# Patient Record
Sex: Female | Born: 1985
Health system: Southern US, Community
[De-identification: ages and names within clinical notes are randomized; demographics above are authoritative.]

## PROBLEM LIST (undated history)

## (undated) DIAGNOSIS — R7303 Prediabetes: Secondary | ICD-10-CM

## (undated) DIAGNOSIS — M5430 Sciatica, unspecified side: Secondary | ICD-10-CM

## (undated) HISTORY — PX: CHOLECYSTECTOMY: SHX55

## (undated) HISTORY — PX: TUBAL LIGATION: SHX77

---

## 2006-07-30 ENCOUNTER — Emergency Department (HOSPITAL_COMMUNITY): Admission: EM | Admit: 2006-07-30 | Discharge: 2006-07-30 | Payer: Self-pay | Admitting: Emergency Medicine

## 2006-08-08 ENCOUNTER — Emergency Department (HOSPITAL_COMMUNITY): Admission: EM | Admit: 2006-08-08 | Discharge: 2006-08-08 | Payer: Self-pay | Admitting: Emergency Medicine

## 2006-10-03 ENCOUNTER — Emergency Department (HOSPITAL_COMMUNITY): Admission: EM | Admit: 2006-10-03 | Discharge: 2006-10-03 | Payer: Self-pay | Admitting: Emergency Medicine

## 2006-12-06 ENCOUNTER — Ambulatory Visit: Payer: Self-pay | Admitting: Nurse Practitioner

## 2006-12-06 LAB — CONVERTED CEMR LAB
Bilirubin Urine: NEGATIVE
Ketones, urine, test strip: NEGATIVE
Protein, U semiquant: NEGATIVE
Urobilinogen, UA: NEGATIVE
pH: 6

## 2007-03-16 ENCOUNTER — Ambulatory Visit: Payer: Self-pay | Admitting: Nurse Practitioner

## 2007-03-16 ENCOUNTER — Other Ambulatory Visit: Admission: RE | Admit: 2007-03-16 | Discharge: 2007-03-16 | Payer: Self-pay | Admitting: Internal Medicine

## 2007-03-16 DIAGNOSIS — M79609 Pain in unspecified limb: Secondary | ICD-10-CM

## 2007-03-16 LAB — CONVERTED CEMR LAB
Albumin: 4.8 g/dL (ref 3.5–5.2)
BUN: 13 mg/dL (ref 6–23)
Blood in Urine, dipstick: NEGATIVE
Calcium: 10.2 mg/dL (ref 8.4–10.5)
Chloride: 105 meq/L (ref 96–112)
Creatinine, Ser: 0.79 mg/dL (ref 0.40–1.20)
Eosinophils Absolute: 0.1 10*3/uL (ref 0.0–0.7)
Glucose, Bld: 83 mg/dL (ref 70–99)
Glucose, Urine, Semiquant: NEGATIVE
HDL: 45 mg/dL (ref >39)
Hemoglobin: 15.1 g/dL — ABNORMAL HIGH (ref 12.0–15.0)
KOH Prep: NEGATIVE
Ketones, urine, test strip: NEGATIVE
Lymphs Abs: 2 10*3/uL (ref 0.7–4.0)
MCHC: 32.8 g/dL (ref 30.0–36.0)
MCV: 89 fL (ref 78.0–100.0)
Monocytes Absolute: 0.7 10*3/uL (ref 0.1–1.0)
Monocytes Relative: 10 % (ref 3–12)
Neutrophils Relative %: 58 % (ref 43–77)
Potassium: 4.5 meq/L (ref 3.5–5.3)
RBC: 5.17 M/uL — ABNORMAL HIGH (ref 3.87–5.11)
Total CHOL/HDL Ratio: 3.4
Triglycerides: 67 mg/dL (ref <150)
WBC Urine, dipstick: NEGATIVE
WBC: 6.6 10*3/uL (ref 4.0–10.5)

## 2007-03-19 ENCOUNTER — Encounter (INDEPENDENT_AMBULATORY_CARE_PROVIDER_SITE_OTHER): Payer: Self-pay | Admitting: Nurse Practitioner

## 2007-03-23 LAB — CONVERTED CEMR LAB: Pap Smear: NEGATIVE

## 2007-04-06 ENCOUNTER — Telehealth (INDEPENDENT_AMBULATORY_CARE_PROVIDER_SITE_OTHER): Payer: Self-pay | Admitting: Nurse Practitioner

## 2007-04-07 ENCOUNTER — Emergency Department (HOSPITAL_COMMUNITY): Admission: EM | Admit: 2007-04-07 | Discharge: 2007-04-07 | Payer: Self-pay | Admitting: Emergency Medicine

## 2007-05-14 ENCOUNTER — Ambulatory Visit: Payer: Self-pay | Admitting: Nurse Practitioner

## 2007-05-14 DIAGNOSIS — F17200 Nicotine dependence, unspecified, uncomplicated: Secondary | ICD-10-CM | POA: Insufficient documentation

## 2007-05-14 DIAGNOSIS — N912 Amenorrhea, unspecified: Secondary | ICD-10-CM

## 2007-05-14 LAB — CONVERTED CEMR LAB
Beta hcg, urine, semiquantitative: POSITIVE
Bilirubin Urine: NEGATIVE
Blood in Urine, dipstick: NEGATIVE
Glucose, Urine, Semiquant: NEGATIVE
pH: 6

## 2008-09-03 ENCOUNTER — Emergency Department (HOSPITAL_BASED_OUTPATIENT_CLINIC_OR_DEPARTMENT_OTHER): Admission: EM | Admit: 2008-09-03 | Discharge: 2008-09-04 | Payer: Self-pay | Admitting: Emergency Medicine

## 2009-11-08 IMAGING — US US OB TRANSVAGINAL MODIFY
1 series · 14 of 28 positions shown · non-contrast
Comparison: none

CLINICAL DATA: 21-year-old female with positive pregnancy test and pelvic pain.  
 OBSTETRICAL ULTRASOUND <14 WKS AND TRANSVAGINAL OB US:
TECHNIQUE: Both transabdominal and transvaginal ultrasound examinations were performed for complete evaluation of the gestation as well as the maternal uterus, adnexal regions, and pelvic cul-de-sac.

[Series 1: unknown · 0.33mm/px · 14 of 50 slices shown]
[im 2/50]
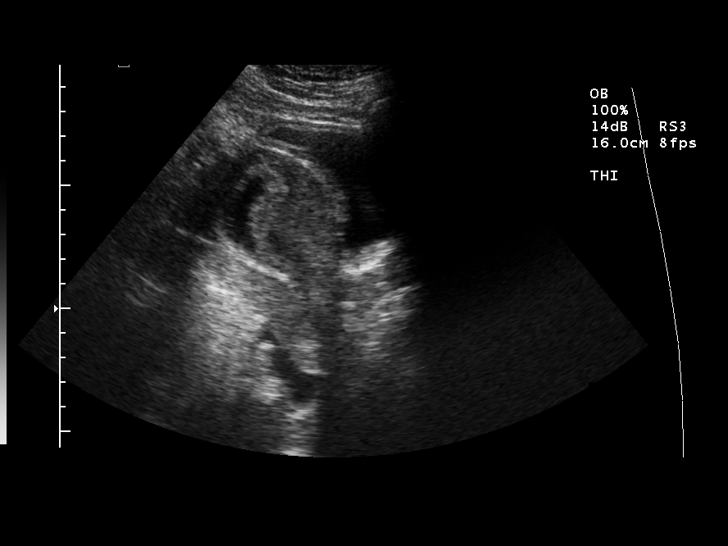
[im 6/50]
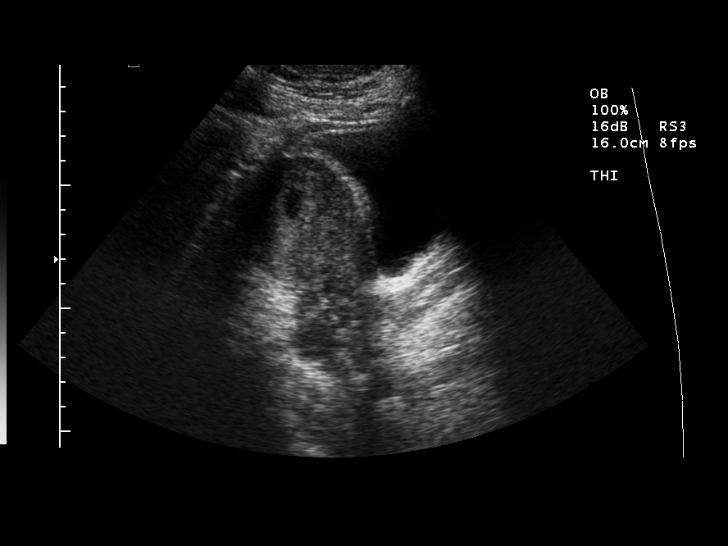
[im 10/50]
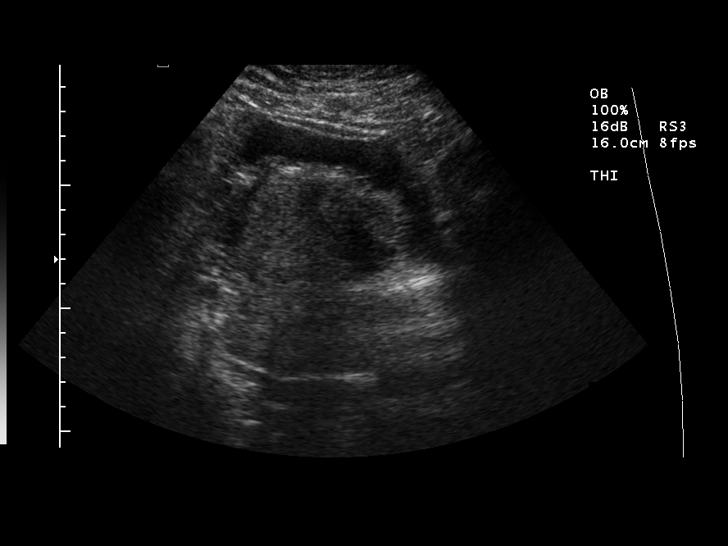
[im 13/50]
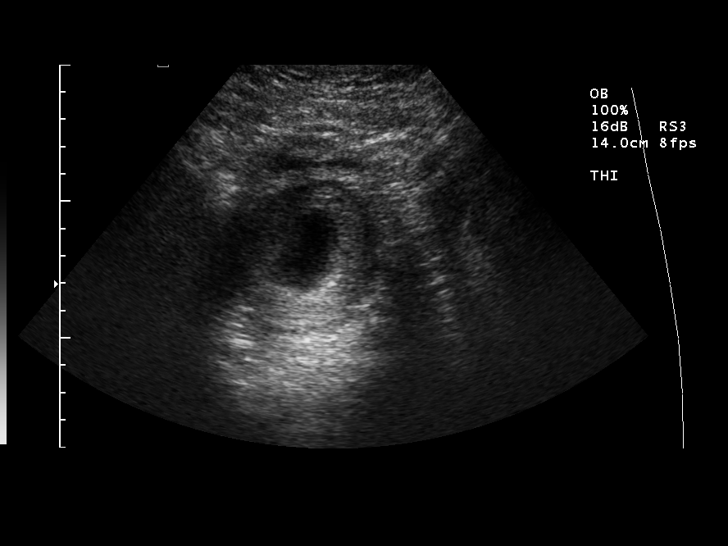
[im 17/50]
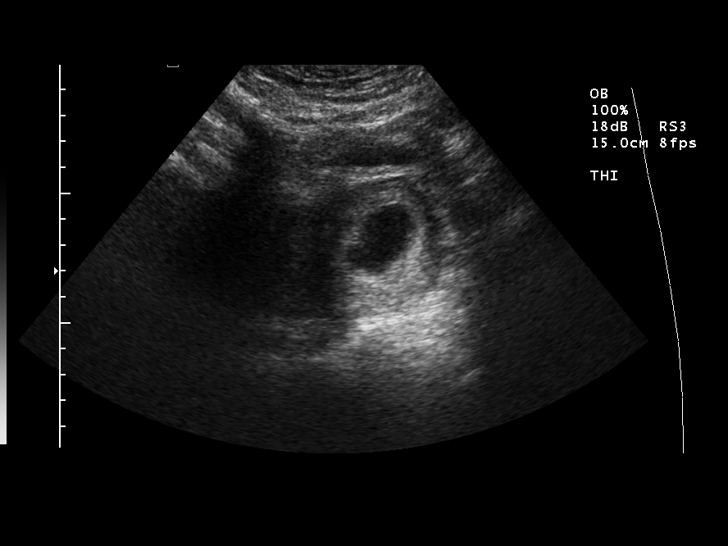
[im 20/50]
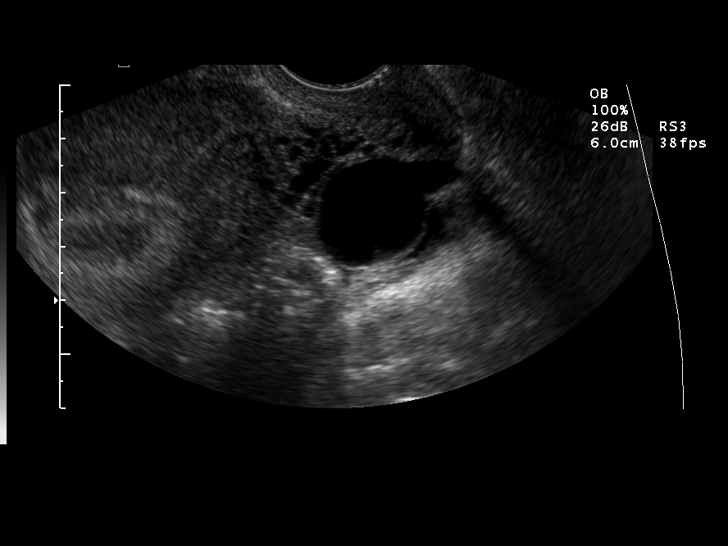
[im 24/50]
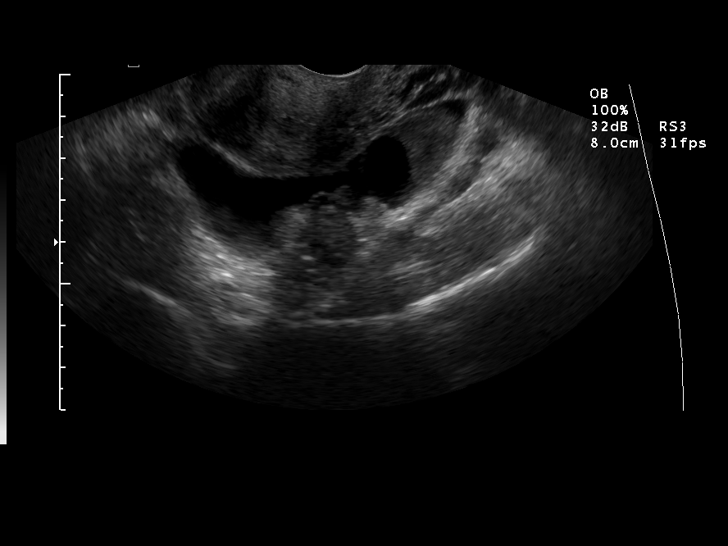
[im 28/50]
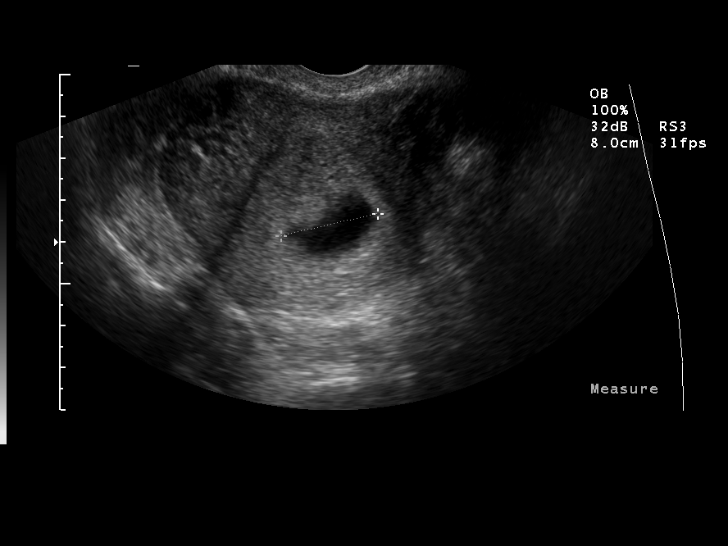
[im 31/50]
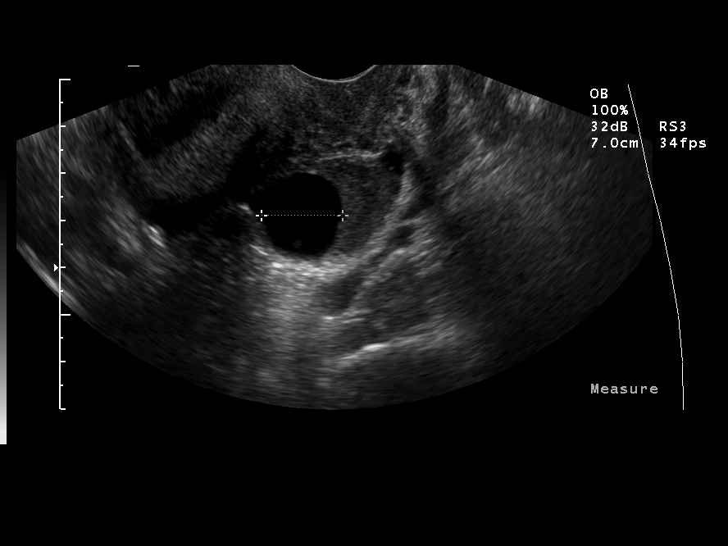
[im 35/50]
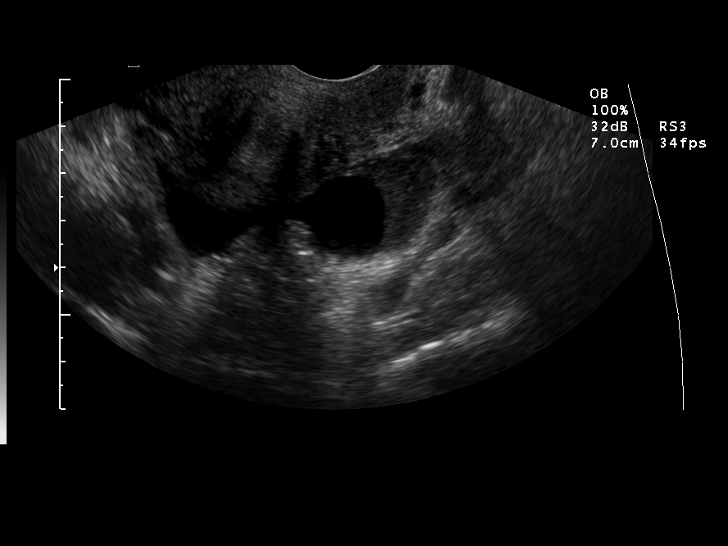
[im 39/50]
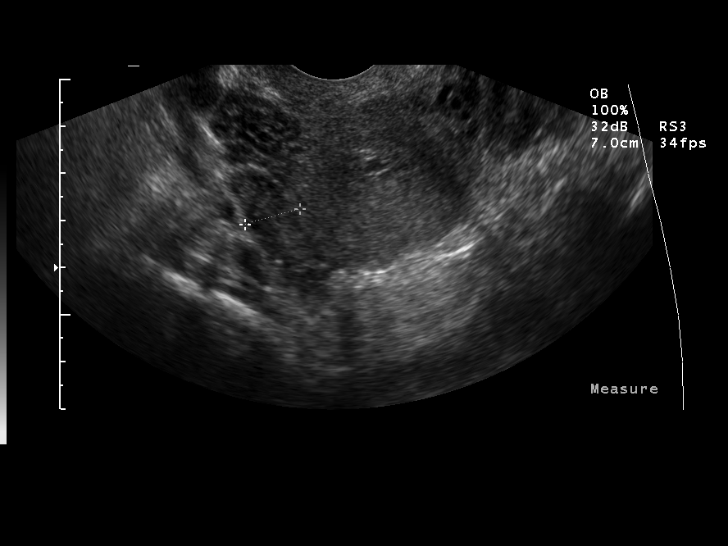
[im 42/50]
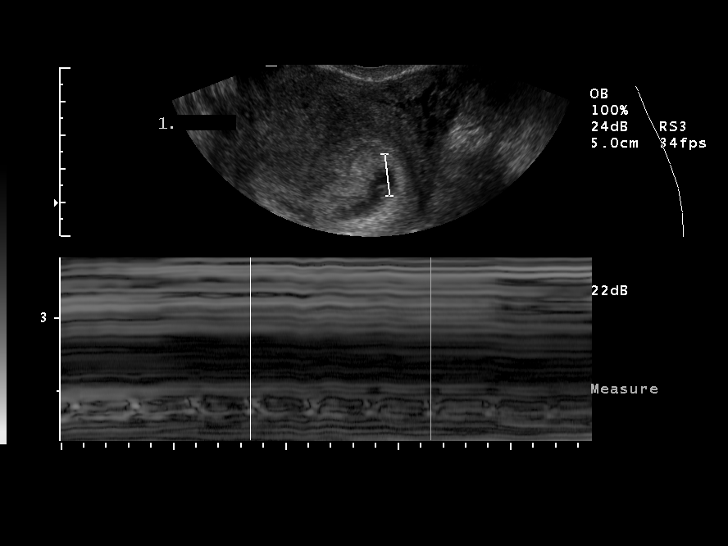
[im 46/50]
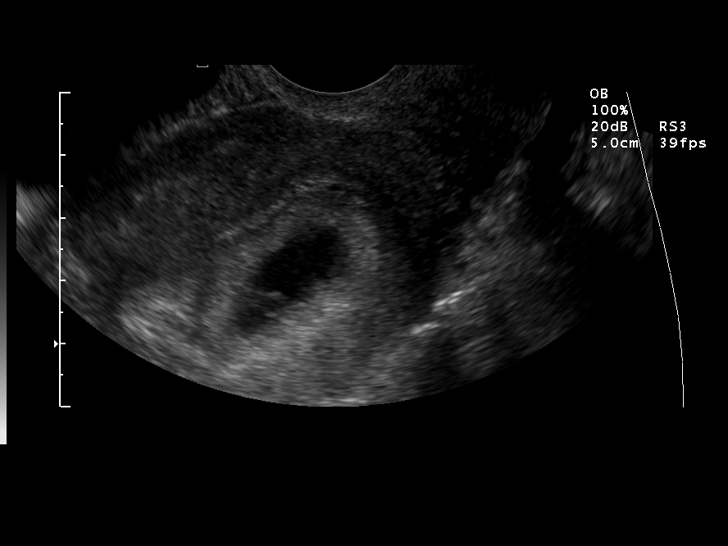
[im 50/50]
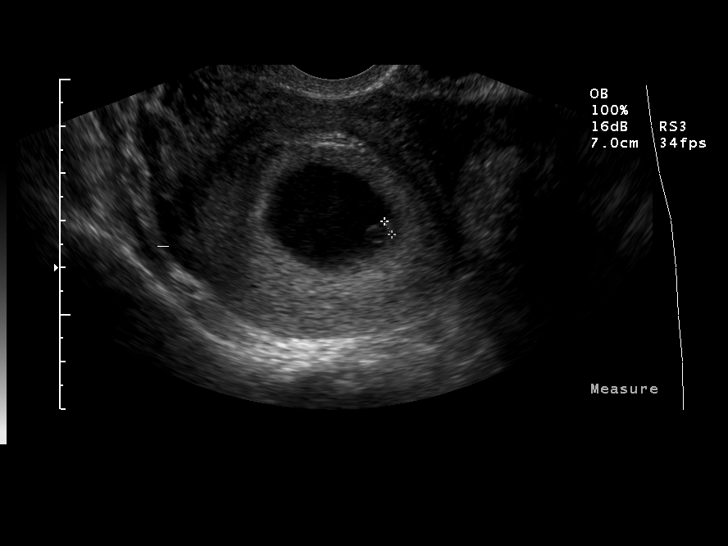

[14 of 28 positions shown; findings below may reference images not displayed]

FINDINGS: There is a single intrauterine gestational sac containing yolk sac and fetus.  Cardiac activity is identified measuring 112 beats per minute.  Crown rump length measures 3.4 mm corresponding to a gestational age of 6 weeks 0 days.  There is no evidence of subchorionic hemorrhage.  The right ovary is unremarkable.  A 2.3 x 1.9 cm simple cyst in the left ovary noted.  A small amount of free fluid in the pelvis is identified.
IMPRESSION: 1.  Single living intrauterine gestation with estimated gestational age of 6 weeks 0 days.  
 2.  2.3 cm left ovarian cyst.

## 2010-05-31 LAB — URINALYSIS, ROUTINE W REFLEX MICROSCOPIC
Glucose, UA: NEGATIVE mg/dL
Ketones, ur: NEGATIVE mg/dL
Nitrite: NEGATIVE
Protein, ur: NEGATIVE mg/dL
Urobilinogen, UA: 1 mg/dL (ref 0.0–1.0)

## 2010-05-31 LAB — PREGNANCY, URINE: Preg Test, Ur: POSITIVE

## 2010-05-31 LAB — WET PREP, GENITAL

## 2010-05-31 LAB — GC/CHLAMYDIA PROBE AMP, GENITAL: Chlamydia, DNA Probe: NEGATIVE

## 2010-11-12 LAB — I-STAT 8, (EC8 V) (CONVERTED LAB)
BUN: 9
Glucose, Bld: 82
Hemoglobin: 13.6
Potassium: 3.9
Sodium: 135

## 2010-11-12 LAB — DIFFERENTIAL
Eosinophils Relative: 1
Lymphocytes Relative: 17
Lymphs Abs: 1.6
Monocytes Absolute: 0.7

## 2010-11-12 LAB — URINALYSIS, ROUTINE W REFLEX MICROSCOPIC
Bilirubin Urine: NEGATIVE
Nitrite: NEGATIVE
Specific Gravity, Urine: 1.025
Urobilinogen, UA: 1

## 2010-11-12 LAB — CBC
HCT: 38
Hemoglobin: 12.9
RDW: 13.4
WBC: 8.9

## 2010-11-12 LAB — PREGNANCY, URINE: Preg Test, Ur: POSITIVE

## 2010-11-12 LAB — ABO/RH: ABO/RH(D): A POS

## 2010-11-12 LAB — GC/CHLAMYDIA PROBE AMP, GENITAL: Chlamydia, DNA Probe: NEGATIVE

## 2010-11-12 LAB — WET PREP, GENITAL: WBC, Wet Prep HPF POC: NONE SEEN

## 2010-12-08 LAB — URINALYSIS, ROUTINE W REFLEX MICROSCOPIC
Glucose, UA: NEGATIVE
Nitrite: NEGATIVE
Protein, ur: NEGATIVE
pH: 7.5

## 2010-12-08 LAB — CBC
MCV: 86.6
Platelets: 294
WBC: 10.8 — ABNORMAL HIGH

## 2010-12-08 LAB — DIFFERENTIAL
Basophils Absolute: 0
Eosinophils Relative: 2
Lymphocytes Relative: 16
Monocytes Absolute: 0.7
Monocytes Relative: 7

## 2010-12-08 LAB — LIPASE, BLOOD: Lipase: 21

## 2010-12-08 LAB — COMPREHENSIVE METABOLIC PANEL
AST: 29
Albumin: 3.5
Chloride: 106
Creatinine, Ser: 0.57
GFR calc Af Amer: >60
Potassium: 4.8
Total Bilirubin: 1.1
Total Protein: 6.6

## 2010-12-08 LAB — URINE MICROSCOPIC-ADD ON

## 2010-12-09 LAB — POCT URINALYSIS DIP (DEVICE)
Bilirubin Urine: NEGATIVE
Glucose, UA: NEGATIVE
Ketones, ur: NEGATIVE
pH: 7.5

## 2010-12-09 LAB — GC/CHLAMYDIA PROBE AMP, GENITAL
Chlamydia, DNA Probe: NEGATIVE
GC Probe Amp, Genital: NEGATIVE

## 2010-12-09 LAB — WET PREP, GENITAL
Trich, Wet Prep: NONE SEEN
Yeast Wet Prep HPF POC: NONE SEEN

## 2010-12-09 LAB — POCT PREGNANCY, URINE: Operator id: 235561

## 2012-10-18 ENCOUNTER — Encounter (HOSPITAL_BASED_OUTPATIENT_CLINIC_OR_DEPARTMENT_OTHER): Payer: Self-pay | Admitting: *Deleted

## 2012-10-18 ENCOUNTER — Emergency Department (HOSPITAL_BASED_OUTPATIENT_CLINIC_OR_DEPARTMENT_OTHER)
Admission: EM | Admit: 2012-10-18 | Discharge: 2012-10-18 | Disposition: A | Discharge status: Home / Self Care | Payer: Medicaid Other | Attending: Emergency Medicine | Admitting: Emergency Medicine

## 2012-10-18 DIAGNOSIS — N390 Urinary tract infection, site not specified: Secondary | ICD-10-CM | POA: Insufficient documentation

## 2012-10-18 DIAGNOSIS — Z3202 Encounter for pregnancy test, result negative: Secondary | ICD-10-CM | POA: Insufficient documentation

## 2012-10-18 DIAGNOSIS — R109 Unspecified abdominal pain: Secondary | ICD-10-CM | POA: Insufficient documentation

## 2012-10-18 DIAGNOSIS — M25559 Pain in unspecified hip: Secondary | ICD-10-CM | POA: Insufficient documentation

## 2012-10-18 DIAGNOSIS — F17200 Nicotine dependence, unspecified, uncomplicated: Secondary | ICD-10-CM | POA: Insufficient documentation

## 2012-10-18 LAB — URINE MICROSCOPIC-ADD ON

## 2012-10-18 LAB — BASIC METABOLIC PANEL
CO2: 31 mEq/L (ref 19–32)
Calcium: 10.2 mg/dL (ref 8.4–10.5)
Chloride: 103 mEq/L (ref 96–112)
Creatinine, Ser: 0.8 mg/dL (ref 0.50–1.10)
Glucose, Bld: 108 mg/dL — ABNORMAL HIGH (ref 70–99)

## 2012-10-18 LAB — URINALYSIS, ROUTINE W REFLEX MICROSCOPIC
Bilirubin Urine: NEGATIVE
Glucose, UA: NEGATIVE mg/dL
Hgb urine dipstick: NEGATIVE
Ketones, ur: NEGATIVE mg/dL
Protein, ur: NEGATIVE mg/dL
pH: 6 (ref 5.0–8.0)

## 2012-10-18 MED ORDER — CIPROFLOXACIN HCL 500 MG PO TABS: 500.0000 mg | ORAL_TABLET | Freq: Two times a day (BID) | ORAL | Status: DC

## 2012-10-18 MED ORDER — KETOROLAC TROMETHAMINE 30 MG/ML IJ SOLN
30.0000 mg | Freq: Once | INTRAMUSCULAR | Status: AC
  Administered 2012-10-18: 30 mg via INTRAVENOUS
  Filled 2012-10-18: qty 1

## 2012-10-18 MED ORDER — ONDANSETRON HCL 4 MG/2ML IJ SOLN
4.0000 mg | Freq: Once | INTRAMUSCULAR | Status: AC
  Administered 2012-10-18: 4 mg via INTRAVENOUS
  Filled 2012-10-18: qty 2

## 2012-10-18 MED ORDER — MORPHINE SULFATE 4 MG/ML IJ SOLN
4.0000 mg | Freq: Once | INTRAMUSCULAR | Status: AC
  Administered 2012-10-18: 4 mg via INTRAVENOUS
  Filled 2012-10-18: qty 1

## 2012-10-18 MED ORDER — HYDROCODONE-ACETAMINOPHEN 5-325 MG PO TABS: 2.0000 | ORAL_TABLET | ORAL | Status: DC | PRN

## 2012-10-18 NOTE — ED Notes (Signed)
Pt up in room walking around talking on phone in no acute distress, pt assisted back to bed prior to given pain medication, requesting fluids and food, informed to wait and see if pain medication works for her pain and then we will see if she can tolerate fluids and food

## 2012-10-18 NOTE — ED Notes (Signed)
Abdominal pain. Hip pain for 3 weeks. Was seen at [facility] and given pain medication.

## 2012-10-18 NOTE — ED Provider Notes (Signed)
CSN: [id number]     Arrival date & time 10/18/12  1916 History   First MD Initiated Contact with Patient 10/18/12 2024     Chief Complaint  Patient presents with  . Abdominal Pain  . Hip Pain   (Consider location/radiation/quality/duration/timing/severity/associated sxs/prior Treatment) HPI Comments: Pt state that she has been having intermittent flank pain and abdominal pain for 3 weeks:pt states that she was seen at [facility] and given pain medications which helped in the short term:pt states that she is continuing to have the symptoms:pt state that's she is not having any urinary symptom, fever, n/v/d:pt states that at [facility] they told her that she may have kidney stone  The history is provided by the patient. No language interpreter was used.    History reviewed. No pertinent past medical history. Past Surgical History  Procedure Laterality Date  . Cholecystectomy     No family history on file. History  Substance Use Topics  . Smoking status: Current Every Day Smoker -- 0.50 packs/day    Types: Cigarettes  . Smokeless tobacco: Not on file  . Alcohol Use: No   OB History   Grav Para Term Preterm Abortions TAB SAB Ect Mult Living                 Review of Systems  Constitutional: Negative.   Respiratory: Negative.   Cardiovascular: Negative.     Allergies  Review of patient's allergies indicates no known allergies.  Home Medications   Current Outpatient Rx  Name  Route  Sig  Dispense  Refill  . HYDROcodone-acetaminophen (NORCO/VICODIN) 5-325 MG per tablet   Oral   Take 1 tablet by mouth every 6 (six) hours as needed for pain.         . promethazine (PHENERGAN) 25 MG tablet   Oral   Take 25 mg by mouth every 6 (six) hours as needed for nausea.          BP 126/84  Pulse 74  Temp(Src) 98.5 F (36.9 C) (Oral)  Resp 18  Ht 5\' 4"  (1.626 m)  Wt 193 lb (87.544 kg)  BMI 33.11 kg/m2  SpO2 100%  LMP 09/21/2012 Physical Exam  Nursing note and vitals  reviewed. Constitutional: She is oriented to person, place, and time. She appears well-developed and well-nourished.  HENT:  Head: Normocephalic.  Eyes: Conjunctivae and EOM are normal.  Cardiovascular: Normal rate and regular rhythm.   Pulmonary/Chest: Effort normal and breath sounds normal.  Abdominal: Soft. Bowel sounds are normal. There is no tenderness.  Generalized left lower back pain  Musculoskeletal: Normal range of motion.  Neurological: She is alert and oriented to person, place, and time.  Skin: Skin is warm and dry.    ED Course  Procedures (including critical care time) Labs Review Labs Reviewed  URINALYSIS, ROUTINE W REFLEX MICROSCOPIC - Abnormal; Notable for the following:    APPearance CLOUDY (*)    Nitrite POSITIVE (*)    Leukocytes, UA SMALL (*)    All other components within normal limits  URINE MICROSCOPIC-ADD ON - Abnormal; Notable for the following:    Squamous Epithelial / LPF FEW (*)    Bacteria, UA FEW (*)    Crystals CA OXALATE CRYSTALS (*)    All other components within normal limits  BASIC METABOLIC PANEL - Abnormal; Notable for the following:    Glucose, Bld 108 (*)    All other components within normal limits  URINE CULTURE  PREGNANCY, URINE  Imaging Review No results found.  MDM   1. Flank pain   2. UTI (lower urinary tract infection)     Pt had negative ct scan from 8/22 don't think further imaging is needed today as symptoms are continuing:will treat for uti as urine is nitrite positive:will give something for pain and have follow up with urology:pt feeling a lot better after the medications:abdomen is benign    Teressa Lower, NP 10/18/12 2232

## 2012-10-20 LAB — URINE CULTURE: Colony Count: >=100000

## 2012-10-20 NOTE — ED Provider Notes (Signed)
Medical screening examination/treatment/procedure(s) were performed by non-physician practitioner and as supervising physician I was immediately available for consultation/collaboration.   Candyce Churn, MD 10/20/12 1250

## 2012-10-23 ENCOUNTER — Emergency Department (HOSPITAL_BASED_OUTPATIENT_CLINIC_OR_DEPARTMENT_OTHER)
Admission: EM | Admit: 2012-10-23 | Discharge: 2012-10-24 | Disposition: A | Discharge status: Home / Self Care | Payer: Medicaid Other | Attending: Emergency Medicine | Admitting: Emergency Medicine

## 2012-10-23 ENCOUNTER — Emergency Department (HOSPITAL_BASED_OUTPATIENT_CLINIC_OR_DEPARTMENT_OTHER): Payer: Medicaid Other

## 2012-10-23 ENCOUNTER — Encounter (HOSPITAL_BASED_OUTPATIENT_CLINIC_OR_DEPARTMENT_OTHER): Payer: Self-pay | Admitting: Emergency Medicine

## 2012-10-23 DIAGNOSIS — R109 Unspecified abdominal pain: Secondary | ICD-10-CM

## 2012-10-23 DIAGNOSIS — Z3202 Encounter for pregnancy test, result negative: Secondary | ICD-10-CM | POA: Insufficient documentation

## 2012-10-23 DIAGNOSIS — M899 Disorder of bone, unspecified: Secondary | ICD-10-CM

## 2012-10-23 DIAGNOSIS — Z792 Long term (current) use of antibiotics: Secondary | ICD-10-CM | POA: Insufficient documentation

## 2012-10-23 DIAGNOSIS — F17200 Nicotine dependence, unspecified, uncomplicated: Secondary | ICD-10-CM | POA: Insufficient documentation

## 2012-10-23 LAB — PREGNANCY, URINE: Preg Test, Ur: NEGATIVE

## 2012-10-23 LAB — URINALYSIS, ROUTINE W REFLEX MICROSCOPIC
Glucose, UA: NEGATIVE mg/dL
Hgb urine dipstick: NEGATIVE
Ketones, ur: 15 mg/dL — AB
Leukocytes, UA: NEGATIVE
Protein, ur: NEGATIVE mg/dL
pH: 7.5 (ref 5.0–8.0)

## 2012-10-23 LAB — BASIC METABOLIC PANEL
Calcium: 10 mg/dL (ref 8.4–10.5)
Creatinine, Ser: 0.8 mg/dL (ref 0.50–1.10)
GFR calc Af Amer: >90 mL/min (ref >90)
GFR calc non Af Amer: >90 mL/min (ref >90)
Sodium: 141 mEq/L (ref 135–145)

## 2012-10-23 LAB — CBC WITH DIFFERENTIAL/PLATELET
Basophils Absolute: 0 10*3/uL (ref 0.0–0.1)
Basophils Relative: 0 % (ref 0–1)
Eosinophils Absolute: 0.4 10*3/uL (ref 0.0–0.7)
Eosinophils Relative: 4 % (ref 0–5)
HCT: 40.3 % (ref 36.0–46.0)
MCHC: 33.5 g/dL (ref 30.0–36.0)
MCV: 85.6 fL (ref 78.0–100.0)
Monocytes Absolute: 1.1 10*3/uL — ABNORMAL HIGH (ref 0.1–1.0)
Platelets: 336 10*3/uL (ref 150–400)
RDW: 13.4 % (ref 11.5–15.5)
WBC: 10.3 10*3/uL (ref 4.0–10.5)

## 2012-10-23 MED ORDER — KETOROLAC TROMETHAMINE 30 MG/ML IJ SOLN
30.0000 mg | Freq: Once | INTRAMUSCULAR | Status: AC
  Administered 2012-10-23: 30 mg via INTRAVENOUS
  Filled 2012-10-23: qty 1

## 2012-10-23 MED ORDER — SODIUM CHLORIDE 0.9 % IV BOLUS (SEPSIS)
1000.0000 mL | Freq: Once | INTRAVENOUS | Status: AC
  Administered 2012-10-23: 1000 mL via INTRAVENOUS

## 2012-10-23 MED ORDER — MORPHINE SULFATE 4 MG/ML IJ SOLN
6.0000 mg | Freq: Once | INTRAMUSCULAR | Status: AC
  Administered 2012-10-23: 6 mg via INTRAVENOUS
  Filled 2012-10-23: qty 2

## 2012-10-23 NOTE — ED Notes (Signed)
Pt reports pain to left flank that radiates to hip, patient recently seen in this er for same problem, sent home with medication that she states did not help pain

## 2012-10-23 NOTE — ED Provider Notes (Signed)
CSN: [id number]     Arrival date & time 10/23/12  2150 History   First MD Initiated Contact with Patient 10/23/12 2208     Chief Complaint  Patient presents with  . Flank Pain   (Consider location/radiation/quality/duration/timing/severity/associated sxs/prior Treatment) The history is provided by the patient.  Stephanie Clayton is a 27 y.o. female hx of cholecystectomy here with flank pain. The flank pain for the last month. Went to [facility] a week ago and had CT abdomen pelvis that did not show any stones. She came in several days ago was diagnosed with possible old arthritis and was started on Cipro. Denies any fevers but has continued pain despite being on Vicodin and Percocet. Denies any dysuria or hematuria.    History reviewed. No pertinent past medical history. Past Surgical History  Procedure Laterality Date  . Cholecystectomy     History reviewed. No pertinent family history. History  Substance Use Topics  . Smoking status: Current Every Day Smoker -- 0.50 packs/day    Types: Cigarettes  . Smokeless tobacco: Not on file  . Alcohol Use: No   OB History   Grav Para Term Preterm Abortions TAB SAB Ect Mult Living                 Review of Systems  Genitourinary: Positive for flank pain.  All other systems reviewed and are negative.    Allergies  Review of patient's allergies indicates no known allergies.  Home Medications   Current Outpatient Rx  Name  Route  Sig  Dispense  Refill  . ciprofloxacin (CIPRO) 500 MG tablet   Oral   Take 1 tablet (500 mg total) by mouth 2 (two) times daily.   14 tablet   0   . HYDROcodone-acetaminophen (NORCO/VICODIN) 5-325 MG per tablet   Oral   Take 1 tablet by mouth every 6 (six) hours as needed for pain.         Marland Kitchen HYDROcodone-acetaminophen (NORCO/VICODIN) 5-325 MG per tablet   Oral   Take 2 tablets by mouth every 4 (four) hours as needed for pain.   10 tablet   0   . promethazine (PHENERGAN) 25 MG tablet   Oral    Take 25 mg by mouth every 6 (six) hours as needed for nausea.          BP 118/72  Pulse 77  Temp(Src) 98.7 F (37.1 C) (Oral)  Resp 20  Ht 5\' 4"  (1.626 m)  Wt 193 lb (87.544 kg)  BMI 33.11 kg/m2  SpO2 100%  LMP 10/22/2012 Physical Exam  Nursing note and vitals reviewed. Constitutional: She is oriented to person, place, and time. She appears well-developed and well-nourished.  Crying in pain   HENT:  Head: Normocephalic.  Mouth/Throat: Oropharynx is clear and moist.  Eyes: Conjunctivae are normal. Pupils are equal, round, and reactive to light.  Neck: Normal range of motion. Neck supple.  Cardiovascular: Normal rate, regular rhythm and normal heart sounds.   Pulmonary/Chest: Effort normal and breath sounds normal. No respiratory distress. She has no wheezes. She has no rales.  Abdominal:  Obese, mild diffuse tenderness even with light touch. No rebound. CVAT bilaterally.   Musculoskeletal: Normal range of motion.  Neurological: She is alert and oriented to person, place, and time.  Skin: Skin is warm and dry.  Psychiatric: She has a normal mood and affect. Her behavior is normal. Judgment and thought content normal.    ED Course  Procedures (including critical care  time) Labs Review Labs Reviewed  URINALYSIS, ROUTINE W REFLEX MICROSCOPIC - Abnormal; Notable for the following:    Ketones, ur 15 (*)    All other components within normal limits  CBC WITH DIFFERENTIAL - Abnormal; Notable for the following:    Monocytes Absolute 1.1 (*)    All other components within normal limits  URINE CULTURE  PREGNANCY, URINE  BASIC METABOLIC PANEL   Imaging Review Ct Abdomen Pelvis Wo Contrast  10/23/2012   *RADIOLOGY REPORT*  Clinical Data: Left flank pain.  Rule out stone.  CT ABDOMEN AND PELVIS WITHOUT CONTRAST  Technique:  Multidetector CT imaging of the abdomen and pelvis was performed following the standard protocol without intravenous contrast.  Comparison: None.  Findings:   BODY WALL: Unremarkable.  LOWER CHEST:  Mediastinum: Unremarkable.  Lungs/pleura: No consolidation.  ABDOMEN/PELVIS:  Liver: No focal abnormality.  Biliary: Cholecystectomy.  This likely accounts for mild intra and extrahepatic biliary prominence for age.  Pancreas: Unremarkable.  Spleen: Unremarkable.  Adrenals: Unremarkable.  Kidneys and ureters: No hydronephrosis or stone.  Bladder: Unremarkable.  Bowel: No obstruction. Normal appendix.  Fluid filled loops of nondilated small bowel in the pelvis, without evidence of focal obstruction.  Retroperitoneum: No mass or adenopathy.  Peritoneum: No free fluid or gas.  Reproductive: Unremarkable.  Vascular: No acute abnormality.  OSSEOUS: 3 cm, lucent lesion within the right ilium, posterior but not contacting the SI joint.  There is faint internal matrix.  The lesion has a well defined sclerotic rim with mild expansion.  There is nonspecific medullary sclerosis in the more lateral right iliac bone, 2 cm in maximal span.  IMPRESSION: 1.  Negative for urolithiasis. 2.  3 cm indeterminate, but benign appearing, lucent bone lesion in the right ilium. Please correlate with presumed outside imaging to confirm stability.   Original Report Authenticated By: Tiburcio Pea    MDM  No diagnosis found. Stephanie Clayton is a 27 y.o. female here with bilateral flank pain. CT showed no stone but likely benign R ilium bone lesion. UA neg UTI or blood. Labs unremarkable. I doubt ovarian pathology. I think she has chronic abdominal pain at this point. She still has pain meds at home so I told her that I won't d/c with pain meds. She will need to f/u with pmd or pain medicine doctor for pain management.     Richardean Canal, MD 10/23/12 [phone]

## 2012-10-25 LAB — URINE CULTURE
Colony Count: NO GROWTH
Culture: NO GROWTH

## 2013-06-07 ENCOUNTER — Emergency Department (HOSPITAL_COMMUNITY)
Admission: EM | Admit: 2013-06-07 | Discharge: 2013-06-07 | Disposition: A | Discharge status: Home / Self Care | Payer: Medicaid Other | Attending: Emergency Medicine | Admitting: Emergency Medicine

## 2013-06-07 ENCOUNTER — Encounter (HOSPITAL_COMMUNITY): Payer: Self-pay | Admitting: Emergency Medicine

## 2013-06-07 DIAGNOSIS — J069 Acute upper respiratory infection, unspecified: Secondary | ICD-10-CM | POA: Insufficient documentation

## 2013-06-07 DIAGNOSIS — F17200 Nicotine dependence, unspecified, uncomplicated: Secondary | ICD-10-CM | POA: Insufficient documentation

## 2013-06-07 DIAGNOSIS — H9209 Otalgia, unspecified ear: Secondary | ICD-10-CM | POA: Insufficient documentation

## 2013-06-07 DIAGNOSIS — Z792 Long term (current) use of antibiotics: Secondary | ICD-10-CM | POA: Insufficient documentation

## 2013-06-07 DIAGNOSIS — B9789 Other viral agents as the cause of diseases classified elsewhere: Secondary | ICD-10-CM | POA: Insufficient documentation

## 2013-06-07 DIAGNOSIS — B349 Viral infection, unspecified: Secondary | ICD-10-CM

## 2013-06-07 DIAGNOSIS — J029 Acute pharyngitis, unspecified: Secondary | ICD-10-CM

## 2013-06-07 MED ORDER — HYDROCODONE-ACETAMINOPHEN 7.5-325 MG/15ML PO SOLN: 10.0000 mL | Freq: Every evening | ORAL | Status: DC | PRN

## 2013-06-07 NOTE — ED Notes (Signed)
Pt states she has been sick for two weeks with sorethroat, ear pain, body aches. She had a temp of 99.1 at home yesterday. No v/d. No meds taken today. She has been drinking tea, and it is not helping. Generalized body pain is 9/10.

## 2013-06-07 NOTE — Discharge Instructions (Signed)
Antibiotic Nonuse  Your caregiver felt that the infection or problem was not one that would be helped with an antibiotic. Infections may be caused by viruses or bacteria. Only a caregiver can tell which one of these is the likely cause of an illness. A cold is the most common cause of infection in both adults and children. A cold is a virus. Antibiotic treatment will have no effect on a viral infection. Viruses can lead to many lost days of work caring for sick children and many missed days of school. Children may catch as many as 10 "colds" or "flus" per year during which they can be tearful, cranky, and uncomfortable. The goal of treating a virus is aimed at keeping the ill person comfortable. Antibiotics are medications used to help the body fight bacterial infections. There are relatively few types of bacteria that cause infections but there are hundreds of viruses. While both viruses and bacteria cause infection they are very different types of germs. A viral infection will typically go away by itself within 7 to 10 days. Bacterial infections may spread or get worse without antibiotic treatment. Examples of bacterial infections are:  Sore throats (like strep throat or tonsillitis).  Infection in the lung (pneumonia).  Ear and skin infections. Examples of viral infections are:  Colds or flus.  Most coughs and bronchitis.  Sore throats not caused by Strep.  Runny noses. It is often best not to take an antibiotic when a viral infection is the cause of the problem. Antibiotics can kill off the helpful bacteria that we have inside our body and allow harmful bacteria to start growing. Antibiotics can cause side effects such as allergies, nausea, and diarrhea without helping to improve the symptoms of the viral infection. Additionally, repeated uses of antibiotics can cause bacteria inside of our body to become resistant. That resistance can be passed onto harmful bacterial. The next time you have  an infection it may be harder to treat if antibiotics are used when they are not needed. Not treating with antibiotics allows our own immune system to develop and take care of infections more efficiently. Also, antibiotics will work better for us when they are prescribed for bacterial infections. Treatments for a child that is ill may include:  Give extra fluids throughout the day to stay hydrated.  Get plenty of rest.  Only give your child over-the-counter or prescription medicines for pain, discomfort, or fever as directed by your caregiver.  The use of a cool mist humidifier may help stuffy noses.  Cold medications if suggested by your caregiver. Your caregiver may decide to start you on an antibiotic if:  The problem you were seen for today continues for a longer length of time than expected.  You develop a secondary bacterial infection. SEEK MEDICAL CARE IF:  Fever lasts longer than 5 days.  Symptoms continue to get worse after 5 to 7 days or become severe.  Difficulty in breathing develops.  Signs of dehydration develop (poor drinking, rare urinating, dark colored urine).  Changes in behavior or worsening tiredness (listlessness or lethargy). Document Released: 04/18/2001 Document Revised: 05/02/2011 Document Reviewed: 10/15/2008 [facility] Patient Information 2014 [place], [place]. Salt Water Gargle This solution will help make your mouth and throat feel better. HOME CARE INSTRUCTIONS   Mix 1 teaspoon of salt in 8 ounces of warm water.  Gargle with this solution as much or often as you need or as directed. Swish and gargle gently if you have any sores or wounds  in your mouth.  Do not swallow this mixture. Document Released: 11/12/2003 Document Revised: 05/02/2011 Document Reviewed: 04/04/2008 [facility] SystemExit[facility] Patient Information 2014 [place], [place]. Pharyngitis Pharyngitis is redness, pain, and swelling (inflammation) of your pharynx.  CAUSES  Pharyngitis is usually caused by  infection. Most of the time, these infections are from viruses (viral) and are part of a cold. However, sometimes pharyngitis is caused by bacteria (bacterial). Pharyngitis can also be caused by allergies. Viral pharyngitis may be spread from person to person by coughing, sneezing, and personal items or utensils (cups, forks, spoons, toothbrushes). Bacterial pharyngitis may be spread from person to person by more intimate contact, such as kissing.  SIGNS AND SYMPTOMS  Symptoms of pharyngitis include:   Sore throat.   Tiredness (fatigue).   Low-grade fever.   Headache.  Joint pain and muscle aches.  Skin rashes.  Swollen lymph nodes.  Plaque-like film on throat or tonsils (often seen with bacterial pharyngitis). DIAGNOSIS  Your health care provider will ask you questions about your illness and your symptoms. Your medical history, along with a physical exam, is often all that is needed to diagnose pharyngitis. Sometimes, a rapid strep test is done. Other lab tests may also be done, depending on the suspected cause.  TREATMENT  Viral pharyngitis will usually get better in 3 4 days without the use of medicine. Bacterial pharyngitis is treated with medicines that kill germs (antibiotics).  HOME CARE INSTRUCTIONS   Drink enough water and fluids to keep your urine clear or pale yellow.   Only take over-the-counter or prescription medicines as directed by your health care provider:   If you are prescribed antibiotics, make sure you finish them even if you start to feel better.   Do not take aspirin.   Get lots of rest.   Gargle with 8 oz of salt water ( tsp of salt per 1 qt of water) as often as every 1 2 hours to soothe your throat.   Throat lozenges (if you are not at risk for choking) or sprays may be used to soothe your throat. SEEK MEDICAL CARE IF:   You have large, tender lumps in your neck.  You have a rash.  You cough up green, yellow-brown, or bloody  spit. SEEK IMMEDIATE MEDICAL CARE IF:   Your neck becomes stiff.  You drool or are unable to swallow liquids.  You vomit or are unable to keep medicines or liquids down.  You have severe pain that does not go away with the use of recommended medicines.  You have trouble breathing (not caused by a stuffy nose). MAKE SURE YOU:   Understand these instructions.  Will watch your condition.  Will get help right away if you are not doing well or get worse. Document Released: 02/07/2005 Document Revised: 11/28/2012 Document Reviewed: 10/15/2012 [facility]burgExitCare Patient Information 2014 [place]ExitCare, MarylandLLC.

## 2013-06-07 NOTE — ED Provider Notes (Signed)
CSN: [id number]     Arrival date & time 06/07/13  1041 History  This chart was scribed for non-physician practitioner working with Elpidio AnisShari Danijela Vessey, by Tana ConchStephen Methvin ED Scribe. This patient was seen in TR05C/TR05C and the patient's care was started at 12:43 PM.    Chief Complaint  Patient presents with  . Sore Throat     The history is provided by the patient. No language interpreter was used.    HPI Comments: Stephanie Clayton is a 28 y.o. female who presents to the Emergency Department complaining of sore throat on "both sides that has been present for 2 weeks. She reports an associated dry cough that has kept her up at night. Pt also reports ear pain and body aches She states that her daughter has had similar symptoms.  TMAX at home 99.1. She has not taken any medication.   History reviewed. No pertinent past medical history. Past Surgical History  Procedure Laterality Date  . Cholecystectomy    . Cesarean section     History reviewed. No pertinent family history. History  Substance Use Topics  . Smoking status: Current Every Day Smoker -- 0.50 packs/day    Types: Cigarettes  . Smokeless tobacco: Not on file  . Alcohol Use: No   OB History   Grav Para Term Preterm Abortions TAB SAB Ect Mult Living                 Review of Systems  Constitutional: Positive for fever (TMAX at home 99.1).  HENT: Positive for ear pain, sore throat and trouble swallowing.   Musculoskeletal: Positive for myalgias.  All other systems reviewed and are negative.     Allergies  Review of patient's allergies indicates no known allergies.  Home Medications   Prior to Admission medications   Medication Sig Start Date End Date Taking? Authorizing Provider  ciprofloxacin (CIPRO) 500 MG tablet Take 1 tablet (500 mg total) by mouth 2 (two) times daily. 10/18/12   Teressa LowerVrinda Pickering, NP  HYDROcodone-acetaminophen (NORCO/VICODIN) 5-325 MG per tablet Take 1 tablet by mouth every 6 (six) hours as  needed for pain.    Historical Provider, MD  HYDROcodone-acetaminophen (NORCO/VICODIN) 5-325 MG per tablet Take 2 tablets by mouth every 4 (four) hours as needed for pain. 10/18/12   Teressa LowerVrinda Pickering, NP  promethazine (PHENERGAN) 25 MG tablet Take 25 mg by mouth every 6 (six) hours as needed for nausea.    Historical Provider, MD   BP 109/77  Pulse 84  Temp(Src) 98.2 F (36.8 C) (Oral)  Resp 17  SpO2 100%  LMP 05/24/2013 Physical Exam  Nursing note and vitals reviewed. Constitutional: She is oriented to person, place, and time. She appears well-developed and well-nourished.  HENT:  Head: Normocephalic and atraumatic.  Mouth/Throat: Posterior oropharyngeal erythema present. No oropharyngeal exudate.  erythematous   Cardiovascular: Normal rate, regular rhythm and normal heart sounds.   Pulmonary/Chest: Effort normal and breath sounds normal.  Musculoskeletal: Normal range of motion.  Lymphadenopathy:    She has cervical adenopathy.  Neurological: She is alert and oriented to person, place, and time.    ED Course  Procedures (including critical care time)  DIAGNOSTIC STUDIES: Oxygen Saturation is 100% on RA, normal by my interpretation.    COORDINATION OF CARE:   12:53 PM-Discussed treatment plan which includes with pt at bedside and pt agreed to plan.   Labs Review Labs Reviewed - No data to display  Imaging Review No results found.   EKG Interpretation  None      MDM   Final diagnoses:  None   I personally performed the services described in this documentation, which was scribed in my presence. The recorded information has been reviewed and is accurate.   1. Pharyngitis 2. URI  VSS, unremarkable exam, afebrile. Suspect viral process and feel she is stable for discharge.     Arnoldo HookerShari A Hill Mackie, PA-C 06/12/13 2256

## 2013-06-14 NOTE — ED Provider Notes (Signed)
Medical screening examination/treatment/procedure(s) were performed by non-physician practitioner and as supervising physician I was immediately available for consultation/collaboration.  Ahmod Gillespie L Leeona Mccardle, MD 06/14/13 2222 

## 2013-08-20 ENCOUNTER — Emergency Department (HOSPITAL_BASED_OUTPATIENT_CLINIC_OR_DEPARTMENT_OTHER)
Admission: EM | Admit: 2013-08-20 | Discharge: 2013-08-20 | Disposition: A | Discharge status: Home / Self Care | Payer: Medicaid Other | Attending: Emergency Medicine | Admitting: Emergency Medicine

## 2013-08-20 ENCOUNTER — Encounter (HOSPITAL_BASED_OUTPATIENT_CLINIC_OR_DEPARTMENT_OTHER): Payer: Self-pay | Admitting: Emergency Medicine

## 2013-08-20 DIAGNOSIS — Z9889 Other specified postprocedural states: Secondary | ICD-10-CM | POA: Insufficient documentation

## 2013-08-20 DIAGNOSIS — B9689 Other specified bacterial agents as the cause of diseases classified elsewhere: Secondary | ICD-10-CM | POA: Insufficient documentation

## 2013-08-20 DIAGNOSIS — Z792 Long term (current) use of antibiotics: Secondary | ICD-10-CM | POA: Insufficient documentation

## 2013-08-20 DIAGNOSIS — R509 Fever, unspecified: Secondary | ICD-10-CM | POA: Insufficient documentation

## 2013-08-20 DIAGNOSIS — Z3202 Encounter for pregnancy test, result negative: Secondary | ICD-10-CM | POA: Insufficient documentation

## 2013-08-20 DIAGNOSIS — A499 Bacterial infection, unspecified: Secondary | ICD-10-CM | POA: Insufficient documentation

## 2013-08-20 DIAGNOSIS — Z9089 Acquired absence of other organs: Secondary | ICD-10-CM | POA: Insufficient documentation

## 2013-08-20 DIAGNOSIS — F17200 Nicotine dependence, unspecified, uncomplicated: Secondary | ICD-10-CM | POA: Insufficient documentation

## 2013-08-20 DIAGNOSIS — N760 Acute vaginitis: Secondary | ICD-10-CM | POA: Insufficient documentation

## 2013-08-20 LAB — WET PREP, GENITAL
TRICH WET PREP: NONE SEEN
YEAST WET PREP: NONE SEEN

## 2013-08-20 LAB — URINALYSIS, ROUTINE W REFLEX MICROSCOPIC
BILIRUBIN URINE: NEGATIVE
Glucose, UA: NEGATIVE mg/dL
HGB URINE DIPSTICK: NEGATIVE
Ketones, ur: NEGATIVE mg/dL
Leukocytes, UA: NEGATIVE
Nitrite: NEGATIVE
PH: 7 (ref 5.0–8.0)
Protein, ur: NEGATIVE mg/dL
SPECIFIC GRAVITY, URINE: 1.022 (ref 1.005–1.030)
Urobilinogen, UA: 1 mg/dL (ref 0.0–1.0)

## 2013-08-20 LAB — PREGNANCY, URINE: PREG TEST UR: NEGATIVE

## 2013-08-20 MED ORDER — METRONIDAZOLE 500 MG PO TABS: 500.0000 mg | ORAL_TABLET | Freq: Two times a day (BID) | ORAL | Status: DC

## 2013-08-20 NOTE — ED Notes (Signed)
Triage delay due to pt in [place]

## 2013-08-20 NOTE — ED Notes (Signed)
abd pain since 6/25-nausea, diarrhea-no vomiting-positive vaginal d/c-denies urinary s/s

## 2013-08-20 NOTE — ED Provider Notes (Signed)
CSN: [id number]     Arrival date & time 08/20/13  1525 History   First MD Initiated Contact with Patient 08/20/13 1528     Chief Complaint  Patient presents with  . Abdominal Pain     (Consider location/radiation/quality/duration/timing/severity/associated sxs/prior Treatment) Patient is a 28 y.o. female presenting with abdominal pain. The history is provided by the patient. No language interpreter was used.  Abdominal Pain Pain location:  Suprapubic Pain quality: aching   Pain radiates to:  Does not radiate Chronicity:  New Relieved by:  Nothing Worsened by:  Nothing tried Associated symptoms: fever and vaginal discharge   Associated symptoms: no anorexia and no vomiting     History reviewed. No pertinent past medical history. Past Surgical History  Procedure Laterality Date  . Cholecystectomy    . Cesarean section     No family history on file. History  Substance Use Topics  . Smoking status: Current Every Day Smoker -- 0.50 packs/day    Types: Cigarettes  . Smokeless tobacco: Not on file  . Alcohol Use: No   OB History   Grav Para Term Preterm Abortions TAB SAB Ect Mult Living                 Review of Systems  Constitutional: Positive for fever.  Gastrointestinal: Positive for abdominal pain. Negative for vomiting and anorexia.  Genitourinary: Positive for vaginal discharge.  All other systems reviewed and are negative.     Allergies  Review of patient's allergies indicates no known allergies.  Home Medications   Prior to Admission medications   Medication Sig Start Date End Date Taking? Authorizing Provider  ciprofloxacin (CIPRO) 500 MG tablet Take 1 tablet (500 mg total) by mouth 2 (two) times daily. 10/18/12   Teressa LowerVrinda Pickering, NP  HYDROcodone-acetaminophen (HYCET) 7.5-325 mg/15 ml solution Take 10 mLs by mouth at bedtime as needed for moderate pain. 06/07/13   Shari A Upstill, PA-C  HYDROcodone-acetaminophen (NORCO/VICODIN) 5-325 MG per tablet Take 1  tablet by mouth every 6 (six) hours as needed for pain.    Historical Provider, MD  HYDROcodone-acetaminophen (NORCO/VICODIN) 5-325 MG per tablet Take 2 tablets by mouth every 4 (four) hours as needed for pain. 10/18/12   Teressa LowerVrinda Pickering, NP  promethazine (PHENERGAN) 25 MG tablet Take 25 mg by mouth every 6 (six) hours as needed for nausea.    Historical Provider, MD   BP 128/72  Pulse 75  Temp(Src) 98 F (36.7 C) (Oral)  Resp 18  Ht 5\' 4"  (1.626 m)  Wt 184 lb 8 oz (83.689 kg)  BMI 31.65 kg/m2  SpO2 100%  LMP 08/08/2013 Physical Exam  Nursing note and vitals reviewed. Constitutional: She is oriented to person, place, and time. She appears well-developed and well-nourished.  HENT:  Head: Normocephalic.  Eyes: EOM are normal. Pupils are equal, round, and reactive to light.  Neck: Normal range of motion.  Cardiovascular: Normal rate and normal heart sounds.   Pulmonary/Chest: Effort normal and breath sounds normal.  Abdominal: She exhibits no distension.  Genitourinary: Vaginal discharge found.  Musculoskeletal: Normal range of motion.  Neurological: She is alert and oriented to person, place, and time.  Psychiatric: She has a normal mood and affect.    ED Course  Procedures (including critical care time) Labs Review Labs Reviewed  URINALYSIS, ROUTINE W REFLEX MICROSCOPIC  PREGNANCY, URINE    Imaging Review No results found.   EKG Interpretation None      MDM   Final diagnoses:  BV (bacterial vaginosis)    Wet prep shows many clue cells  Pt given rx for flagyl    Elson AreasLeslie K Skylarr Liz, PA-C 08/20/13 [id number]  Acacia Latorre K MarengoSofia, [place] JerseyPA-C 08/20/13 1645

## 2013-08-20 NOTE — Discharge Instructions (Signed)
Bacterial Vaginosis Bacterial vaginosis is a vaginal infection that occurs when the normal balance of bacteria in the vagina is disrupted. It results from an overgrowth of certain bacteria. This is the most common vaginal infection in women of childbearing age. Treatment is important to prevent complications, especially in pregnant women, as it can cause a premature delivery. CAUSES  Bacterial vaginosis is caused by an increase in harmful bacteria that are normally present in smaller amounts in the vagina. Several different kinds of bacteria can cause bacterial vaginosis. However, the reason that the condition develops is not fully understood. RISK FACTORS Certain activities or behaviors can put you at an increased risk of developing bacterial vaginosis, including:  Having a new sex partner or multiple sex partners.  Douching.  Using an intrauterine device (IUD) for contraception. Women do not get bacterial vaginosis from toilet seats, bedding, swimming pools, or contact with objects around them. SIGNS AND SYMPTOMS  Some women with bacterial vaginosis have no signs or symptoms. Common symptoms include:  Grey vaginal discharge.  A fishlike odor with discharge, especially after sexual intercourse.  Itching or burning of the vagina and vulva.  Burning or pain with urination. DIAGNOSIS  Your health care provider will take a medical history and examine the vagina for signs of bacterial vaginosis. A sample of vaginal fluid may be taken. Your health care provider will look at this sample under a microscope to check for bacteria and abnormal cells. A vaginal pH test may also be done.  TREATMENT  Bacterial vaginosis may be treated with antibiotic medicines. These may be given in the form of a pill or a vaginal cream. A second round of antibiotics may be prescribed if the condition comes back after treatment.  HOME CARE INSTRUCTIONS   Only take over-the-counter or prescription medicines as  directed by your health care provider.  If antibiotic medicine was prescribed, take it as directed. Make sure you finish it even if you start to feel better.  Do not have sex until treatment is completed.  Tell all sexual partners that you have a vaginal infection. They should see their health care provider and be treated if they have problems, such as a mild rash or itching.  Practice safe sex by using condoms and only having one sex partner. SEEK MEDICAL CARE IF:   Your symptoms are not improving after 3 days of treatment.  You have increased discharge or pain.  You have a fever. MAKE SURE YOU:   Understand these instructions.  Will watch your condition.  Will get help right away if you are not doing well or get worse. FOR MORE INFORMATION  Centers for Disease Control and Prevention, Division of STD Prevention: www.cdc.gov/std American Sexual Health Association (ASHA): www.ashastd.org  Document Released: 02/07/2005 Document Revised: 11/28/2012 Document Reviewed: 09/19/2012 ExitCare Patient Information 2015 [employer]. This information is not intended to replace advice given to you by your health care provider. Make sure you discuss any questions you have with your health care provider.  

## 2013-08-21 LAB — GC/CHLAMYDIA PROBE AMP
CT PROBE, AMP APTIMA: NEGATIVE
GC Probe RNA: NEGATIVE

## 2013-08-21 NOTE — ED Provider Notes (Signed)
Medical screening examination/treatment/procedure(s) were performed by non-physician practitioner and as supervising physician I was immediately available for consultation/collaboration.   EKG Interpretation None        Arlyne Brandes J. Kolby Schara, MD 08/21/13 1554 

## 2013-09-04 ENCOUNTER — Encounter (HOSPITAL_BASED_OUTPATIENT_CLINIC_OR_DEPARTMENT_OTHER): Payer: Self-pay | Admitting: Emergency Medicine

## 2013-09-04 ENCOUNTER — Emergency Department (HOSPITAL_BASED_OUTPATIENT_CLINIC_OR_DEPARTMENT_OTHER)
Admission: EM | Admit: 2013-09-04 | Discharge: 2013-09-04 | Disposition: A | Discharge status: Home / Self Care | Payer: Medicaid Other | Attending: Emergency Medicine | Admitting: Emergency Medicine

## 2013-09-04 DIAGNOSIS — Z3202 Encounter for pregnancy test, result negative: Secondary | ICD-10-CM | POA: Diagnosis not present

## 2013-09-04 DIAGNOSIS — F17200 Nicotine dependence, unspecified, uncomplicated: Secondary | ICD-10-CM | POA: Diagnosis not present

## 2013-09-04 DIAGNOSIS — B9689 Other specified bacterial agents as the cause of diseases classified elsewhere: Secondary | ICD-10-CM

## 2013-09-04 DIAGNOSIS — Z792 Long term (current) use of antibiotics: Secondary | ICD-10-CM | POA: Insufficient documentation

## 2013-09-04 DIAGNOSIS — N9489 Other specified conditions associated with female genital organs and menstrual cycle: Secondary | ICD-10-CM | POA: Diagnosis present

## 2013-09-04 DIAGNOSIS — Z79899 Other long term (current) drug therapy: Secondary | ICD-10-CM | POA: Diagnosis not present

## 2013-09-04 DIAGNOSIS — N760 Acute vaginitis: Secondary | ICD-10-CM | POA: Diagnosis not present

## 2013-09-04 LAB — URINALYSIS, ROUTINE W REFLEX MICROSCOPIC
Bilirubin Urine: NEGATIVE
GLUCOSE, UA: NEGATIVE mg/dL
HGB URINE DIPSTICK: NEGATIVE
Ketones, ur: NEGATIVE mg/dL
Nitrite: NEGATIVE
PROTEIN: NEGATIVE mg/dL
Specific Gravity, Urine: 1.023 (ref 1.005–1.030)
UROBILINOGEN UA: 1 mg/dL (ref 0.0–1.0)
pH: 5.5 (ref 5.0–8.0)

## 2013-09-04 LAB — PREGNANCY, URINE: Preg Test, Ur: NEGATIVE

## 2013-09-04 LAB — URINE MICROSCOPIC-ADD ON

## 2013-09-04 LAB — WET PREP, GENITAL
Trich, Wet Prep: NONE SEEN
YEAST WET PREP: NONE SEEN

## 2013-09-04 MED ORDER — FLUCONAZOLE 150 MG PO TABS: 150.0000 mg | ORAL_TABLET | Freq: Every day | ORAL | Status: DC

## 2013-09-04 MED ORDER — METRONIDAZOLE 500 MG PO TABS: 500.0000 mg | ORAL_TABLET | Freq: Two times a day (BID) | ORAL | Status: DC

## 2013-09-04 NOTE — ED Provider Notes (Signed)
CSN: [id number]     Arrival date & time 09/04/13  1343 History   First MD Initiated Contact with Patient 09/04/13 1425     Chief Complaint  Patient presents with  . Vaginal Itching     (Consider location/radiation/quality/duration/timing/severity/associated sxs/prior Treatment) Patient is a 28 y.o. female presenting with vaginal itching. The history is provided by the patient. No language interpreter was used.  Vaginal Itching This is a new problem. The current episode started today. The problem occurs constantly. The problem has been gradually worsening. Nothing aggravates the symptoms. She has tried nothing for the symptoms. The treatment provided moderate relief.    History reviewed. No pertinent past medical history. Past Surgical History  Procedure Laterality Date  . Cholecystectomy    . Cesarean section     History reviewed. No pertinent family history. History  Substance Use Topics  . Smoking status: Current Every Day Smoker -- 0.50 packs/day    Types: Cigarettes  . Smokeless tobacco: Not on file  . Alcohol Use: No   OB History   Grav Para Term Preterm Abortions TAB SAB Ect Mult Living                 Review of Systems  All other systems reviewed and are negative.     Allergies  Review of patient's allergies indicates no known allergies.  Home Medications   Prior to Admission medications   Medication Sig Start Date End Date Taking? Authorizing Provider  ciprofloxacin (CIPRO) 500 MG tablet Take 1 tablet (500 mg total) by mouth 2 (two) times daily. 10/18/12   Teressa LowerVrinda Pickering, NP  HYDROcodone-acetaminophen (HYCET) 7.5-325 mg/15 ml solution Take 10 mLs by mouth at bedtime as needed for moderate pain. 06/07/13   Shari A Upstill, PA-C  HYDROcodone-acetaminophen (NORCO/VICODIN) 5-325 MG per tablet Take 1 tablet by mouth every 6 (six) hours as needed for pain.    Historical Provider, MD  HYDROcodone-acetaminophen (NORCO/VICODIN) 5-325 MG per tablet Take 2 tablets by  mouth every 4 (four) hours as needed for pain. 10/18/12   Teressa LowerVrinda Pickering, NP  metroNIDAZOLE (FLAGYL) 500 MG tablet Take 1 tablet (500 mg total) by mouth 2 (two) times daily. 08/20/13   Elson AreasLeslie K Sofia, PA-C  promethazine (PHENERGAN) 25 MG tablet Take 25 mg by mouth every 6 (six) hours as needed for nausea.    Historical Provider, MD   BP 124/67  Pulse 72  Temp(Src) 97.8 F (36.6 C) (Oral)  Resp 16  Ht 5\' 4"  (1.626 m)  Wt 184 lb (83.462 kg)  BMI 31.57 kg/m2  SpO2 100%  LMP 08/08/2013 Physical Exam  Nursing note and vitals reviewed. Constitutional: She is oriented to person, place, and time. She appears well-developed and well-nourished.  HENT:  Head: Normocephalic.  Eyes: Pupils are equal, round, and reactive to light.  Neck: Normal range of motion.  Cardiovascular: Normal rate and normal heart sounds.   Pulmonary/Chest: Effort normal.  Genitourinary: Vaginal discharge found.  Musculoskeletal: She exhibits tenderness.  Neurological: She is alert and oriented to person, place, and time. She has normal reflexes.  Skin: Skin is warm.  Psychiatric: She has a normal mood and affect.    ED Course  Procedures (including critical care time) Labs Review Labs Reviewed  WET PREP, GENITAL - Abnormal; Notable for the following:    Clue Cells Wet Prep HPF POC FEW (*)    WBC, Wet Prep HPF POC FEW (*)    All other components within normal limits  URINALYSIS, ROUTINE W  REFLEX MICROSCOPIC - Abnormal; Notable for the following:    Leukocytes, UA SMALL (*)    All other components within normal limits  URINE MICROSCOPIC-ADD ON - Abnormal; Notable for the following:    Squamous Epithelial / LPF FEW (*)    Bacteria, UA MANY (*)    All other components within normal limits  GC/CHLAMYDIA PROBE AMP  PREGNANCY, URINE    Imaging Review No results found.   EKG Interpretation None      MDM   Final diagnoses:  BV (bacterial vaginosis)    Diflucan Flagyl     Elson Areas,  PA-C 09/04/13 1619

## 2013-09-04 NOTE — Discharge Instructions (Signed)
Bacterial Vaginosis Bacterial vaginosis is a vaginal infection that occurs when the normal balance of bacteria in the vagina is disrupted. It results from an overgrowth of certain bacteria. This is the most common vaginal infection in women of childbearing age. Treatment is important to prevent complications, especially in pregnant women, as it can cause a premature delivery. CAUSES  Bacterial vaginosis is caused by an increase in harmful bacteria that are normally present in smaller amounts in the vagina. Several different kinds of bacteria can cause bacterial vaginosis. However, the reason that the condition develops is not fully understood. RISK FACTORS Certain activities or behaviors can put you at an increased risk of developing bacterial vaginosis, including:  Having a new sex partner or multiple sex partners.  Douching.  Using an intrauterine device (IUD) for contraception. Women do not get bacterial vaginosis from toilet seats, bedding, swimming pools, or contact with objects around them. SIGNS AND SYMPTOMS  Some women with bacterial vaginosis have no signs or symptoms. Common symptoms include:  Grey vaginal discharge.  A fishlike odor with discharge, especially after sexual intercourse.  Itching or burning of the vagina and vulva.  Burning or pain with urination. DIAGNOSIS  Your health care provider will take a medical history and examine the vagina for signs of bacterial vaginosis. A sample of vaginal fluid may be taken. Your health care provider will look at this sample under a microscope to check for bacteria and abnormal cells. A vaginal pH test may also be done.  TREATMENT  Bacterial vaginosis may be treated with antibiotic medicines. These may be given in the form of a pill or a vaginal cream. A second round of antibiotics may be prescribed if the condition comes back after treatment.  HOME CARE INSTRUCTIONS   Only take over-the-counter or prescription medicines as  directed by your health care provider.  If antibiotic medicine was prescribed, take it as directed. Make sure you finish it even if you start to feel better.  Do not have sex until treatment is completed.  Tell all sexual partners that you have a vaginal infection. They should see their health care provider and be treated if they have problems, such as a mild rash or itching.  Practice safe sex by using condoms and only having one sex partner. SEEK MEDICAL CARE IF:   Your symptoms are not improving after 3 days of treatment.  You have increased discharge or pain.  You have a fever. MAKE SURE YOU:   Understand these instructions.  Will watch your condition.  Will get help right away if you are not doing well or get worse. FOR MORE INFORMATION  Centers for Disease Control and Prevention, Division of STD Prevention: www.cdc.gov/std American Sexual Health Association (ASHA): www.ashastd.org  Document Released: 02/07/2005 Document Revised: 11/28/2012 Document Reviewed: 09/19/2012 ExitCare Patient Information 2015 [employer]. This information is not intended to replace advice given to you by your health care provider. Make sure you discuss any questions you have with your health care provider. Monilial Vaginitis Vaginitis in a soreness, swelling and redness (inflammation) of the vagina and vulva. Monilial vaginitis is not a sexually transmitted infection. CAUSES  Yeast vaginitis is caused by yeast (candida) that is normally found in your vagina. With a yeast infection, the candida has overgrown in number to a point that upsets the chemical balance. SYMPTOMS   White, thick vaginal discharge.  Swelling, itching, redness and irritation of the vagina and possibly the lips of the vagina (vulva).  Burning or painful   urination.  Painful intercourse. DIAGNOSIS  Things that may contribute to monilial vaginitis are:  Postmenopausal and virginal  states.  Pregnancy.  Infections.  Being tired, sick or stressed, especially if you had monilial vaginitis in the past.  Diabetes. Good control will help lower the chance.  Birth control pills.  Tight fitting garments.  Using bubble bath, feminine sprays, douches or deodorant tampons.  Taking certain medications that kill germs (antibiotics).  Sporadic recurrence can occur if you become ill. TREATMENT  Your caregiver will give you medication.  There are several kinds of anti monilial vaginal creams and suppositories specific for monilial vaginitis. For recurrent yeast infections, use a suppository or cream in the vagina 2 times a week, or as directed.  Anti-monilial or steroid cream for the itching or irritation of the vulva may also be used. Get your caregiver's permission.  Painting the vagina with methylene blue solution may help if the monilial cream does not work.  Eating yogurt may help prevent monilial vaginitis. HOME CARE INSTRUCTIONS   Finish all medication as prescribed.  Do not have sex until treatment is completed or after your caregiver tells you it is okay.  Take warm sitz baths.  Do not douche.  Do not use tampons, especially scented ones.  Wear cotton underwear.  Avoid tight pants and panty hose.  Tell your sexual partner that you have a yeast infection. They should go to their caregiver if they have symptoms such as mild rash or itching.  Your sexual partner should be treated as well if your infection is difficult to eliminate.  Practice safer sex. Use condoms.  Some vaginal medications cause latex condoms to fail. Vaginal medications that harm condoms are:  Cleocin cream.  Butoconazole (Femstat).  Terconazole (Terazol) vaginal suppository.  Miconazole (Monistat) (may be purchased over the counter). SEEK MEDICAL CARE IF:   You have a temperature by mouth above 102 F (38.9 C).  The infection is getting worse after 2 days of  treatment.  The infection is not getting better after 3 days of treatment.  You develop blisters in or around your vagina.  You develop vaginal bleeding, and it is not your menstrual period.  You have pain when you urinate.  You develop intestinal problems.  You have pain with sexual intercourse. Document Released: 11/17/2004 Document Revised: 05/02/2011 Document Reviewed: 08/01/2008 ExitCare Patient Information 2015 [employer]. This information is not intended to replace advice given to you by your health care provider. Make sure you discuss any questions you have with your health care provider.  

## 2013-09-04 NOTE — ED Notes (Signed)
Pt see here last month DX UTI, reports cont symptoms but also c/o vaginal itching and swelling x 1 week

## 2013-09-05 LAB — GC/CHLAMYDIA PROBE AMP
CT Probe RNA: NEGATIVE
GC Probe RNA: NEGATIVE

## 2013-09-06 NOTE — ED Provider Notes (Signed)
Medical screening examination/treatment/procedure(s) were performed by non-physician practitioner and as supervising physician I was immediately available for consultation/collaboration.   EKG Interpretation None       Cedrica Brune R. Eulalah Rupert, MD 09/06/13 1049 

## 2013-11-15 DIAGNOSIS — Z79899 Other long term (current) drug therapy: Secondary | ICD-10-CM | POA: Insufficient documentation

## 2013-11-15 DIAGNOSIS — Z792 Long term (current) use of antibiotics: Secondary | ICD-10-CM | POA: Diagnosis not present

## 2013-11-15 DIAGNOSIS — Z3202 Encounter for pregnancy test, result negative: Secondary | ICD-10-CM | POA: Insufficient documentation

## 2013-11-15 DIAGNOSIS — N949 Unspecified condition associated with female genital organs and menstrual cycle: Secondary | ICD-10-CM | POA: Insufficient documentation

## 2013-11-15 DIAGNOSIS — R109 Unspecified abdominal pain: Secondary | ICD-10-CM | POA: Diagnosis present

## 2013-11-15 DIAGNOSIS — F17200 Nicotine dependence, unspecified, uncomplicated: Secondary | ICD-10-CM | POA: Diagnosis not present

## 2013-11-15 DIAGNOSIS — Z9089 Acquired absence of other organs: Secondary | ICD-10-CM | POA: Diagnosis not present

## 2013-11-16 ENCOUNTER — Emergency Department (HOSPITAL_BASED_OUTPATIENT_CLINIC_OR_DEPARTMENT_OTHER)
Admission: EM | Admit: 2013-11-16 | Discharge: 2013-11-16 | Disposition: A | Discharge status: Home / Self Care | Payer: Medicaid Other | Attending: Emergency Medicine | Admitting: Emergency Medicine

## 2013-11-16 DIAGNOSIS — R102 Pelvic and perineal pain: Secondary | ICD-10-CM

## 2013-11-16 LAB — URINALYSIS, ROUTINE W REFLEX MICROSCOPIC
Bilirubin Urine: NEGATIVE
GLUCOSE, UA: NEGATIVE mg/dL
Hgb urine dipstick: NEGATIVE
KETONES UR: NEGATIVE mg/dL
Leukocytes, UA: NEGATIVE
Nitrite: NEGATIVE
Protein, ur: NEGATIVE mg/dL
SPECIFIC GRAVITY, URINE: 1.021 (ref 1.005–1.030)
Urobilinogen, UA: 1 mg/dL (ref 0.0–1.0)
pH: 7 (ref 5.0–8.0)

## 2013-11-16 LAB — PREGNANCY, URINE: PREG TEST UR: NEGATIVE

## 2013-11-16 LAB — WET PREP, GENITAL
CLUE CELLS WET PREP: NONE SEEN
TRICH WET PREP: NONE SEEN
Yeast Wet Prep HPF POC: NONE SEEN

## 2013-11-16 LAB — URINE MICROSCOPIC-ADD ON

## 2013-11-16 MED ORDER — DICYCLOMINE HCL 10 MG PO CAPS
10.0000 mg | ORAL_CAPSULE | Freq: Once | ORAL | Status: AC
Start: 2013-11-16 — End: 2013-11-16
  Administered 2013-11-16: 10 mg via ORAL
  Filled 2013-11-16: qty 1

## 2013-11-16 MED ORDER — DICYCLOMINE HCL 20 MG PO TABS: 20.0000 mg | ORAL_TABLET | Freq: Two times a day (BID) | ORAL | Status: DC

## 2013-11-16 MED ORDER — KETOROLAC TROMETHAMINE 60 MG/2ML IM SOLN: 60.0000 mg | Freq: Once | INTRAMUSCULAR | Status: DC

## 2013-11-16 MED ORDER — NAPROXEN 250 MG PO TABS
500.0000 mg | ORAL_TABLET | Freq: Once | ORAL | Status: AC
  Administered 2013-11-16: 500 mg via ORAL
  Filled 2013-11-16: qty 2

## 2013-11-16 NOTE — ED Provider Notes (Signed)
CSN: [id number]     Arrival date & time 11/15/13  2356 History   First MD Initiated Contact with Patient 11/16/13 0041     Chief Complaint  Patient presents with  . Abdominal Pain     (Consider location/radiation/quality/duration/timing/severity/associated sxs/prior Treatment) Patient is a 28 y.o. female presenting with abdominal pain. The history is provided by the patient.  Abdominal Pain Pain location:  Suprapubic Pain quality: cramping   Pain radiates to:  Does not radiate Pain severity:  Moderate Onset quality:  Gradual Timing:  Constant Progression:  Worsening Chronicity:  New Context: not alcohol use   Worsened by:  Nothing tried Associated symptoms: vaginal discharge   Associated symptoms: no anorexia, no dysuria and no nausea   Risk factors: no alcohol abuse   Patient reports pain made worse this evening by 1 hour of intercourse just prior to arrival.    No past medical history on file. Past Surgical History  Procedure Laterality Date  . Cholecystectomy    . Cesarean section     No family history on file. History  Substance Use Topics  . Smoking status: Current Every Day Smoker -- 0.50 packs/day    Types: Cigarettes  . Smokeless tobacco: Not on file  . Alcohol Use: No   OB History   Grav Para Term Preterm Abortions TAB SAB Ect Mult Living                 Review of Systems  Gastrointestinal: Positive for abdominal pain. Negative for nausea and anorexia.  Genitourinary: Positive for vaginal discharge. Negative for dysuria.  All other systems reviewed and are negative.     Allergies  Review of patient's allergies indicates no known allergies.  Home Medications   Prior to Admission medications   Medication Sig Start Date End Date Taking? Authorizing Provider  ciprofloxacin (CIPRO) 500 MG tablet Take 1 tablet (500 mg total) by mouth 2 (two) times daily. 10/18/12   Teressa Lower, NP  fluconazole (DIFLUCAN) 150 MG tablet Take 1 tablet (150 mg total)  by mouth daily. 09/04/13   Elson Areas, PA-C  HYDROcodone-acetaminophen (HYCET) 7.5-325 mg/15 ml solution Take 10 mLs by mouth at bedtime as needed for moderate pain. 06/07/13   Shari A Upstill, PA-C  HYDROcodone-acetaminophen (NORCO/VICODIN) 5-325 MG per tablet Take 1 tablet by mouth every 6 (six) hours as needed for pain.    Historical Provider, MD  HYDROcodone-acetaminophen (NORCO/VICODIN) 5-325 MG per tablet Take 2 tablets by mouth every 4 (four) hours as needed for pain. 10/18/12   Teressa Lower, NP  metroNIDAZOLE (FLAGYL) 500 MG tablet Take 1 tablet (500 mg total) by mouth 2 (two) times daily. 08/20/13   Elson Areas, PA-C  metroNIDAZOLE (FLAGYL) 500 MG tablet Take 1 tablet (500 mg total) by mouth 2 (two) times daily. 09/04/13   Elson Areas, PA-C  promethazine (PHENERGAN) 25 MG tablet Take 25 mg by mouth every 6 (six) hours as needed for nausea.    Historical Provider, MD   BP 131/87  Pulse 69  Temp(Src) 98.1 F (36.7 C) (Oral)  Resp 20  Ht  (1.626 m)  Wt 184 lb (83.462 kg)  BMI 31.57 kg/m2  SpO2 100%  LMP 11/10/2013 Physical Exam  Constitutional: She is oriented to person, place, and time. She appears well-developed and well-nourished. No distress.  HENT:  Head: Normocephalic and atraumatic.  Mouth/Throat: Oropharynx is clear and moist.  Eyes: Conjunctivae are normal. Pupils are equal, round, and reactive to light.  Neck: Normal range of motion. Neck supple.  Cardiovascular: Normal rate, regular rhythm and intact distal pulses.   Pulmonary/Chest: Effort normal and breath sounds normal. She has no wheezes. She has no rales.  Abdominal: Soft. Bowel sounds are increased. There is no tenderness. There is no rebound and no guarding.  Genitourinary: No vaginal discharge found.  Chaperone present no CMT or adnexal tenderness  Musculoskeletal: Normal range of motion.  Neurological: She is alert and oriented to person, place, and time.  Skin: Skin is warm and dry.   Psychiatric: She has a normal mood and affect.    ED Course  Procedures (including critical care time) Labs Review Labs Reviewed  WET PREP, GENITAL  GC/CHLAMYDIA PROBE AMP  PREGNANCY, URINE  URINALYSIS, ROUTINE W REFLEX MICROSCOPIC    Imaging Review No results found.   EKG Interpretation None      MDM   Final diagnoses:  None    Very gassy on exam, palpable stool throughout the colon.  Recommend miralax for same.  No appreciable discharge.  Recommend close follow up with your gynecologist.      Rashidah [place] K Alina Gilkey-Rasch, MD 11/16/13 [phone]

## 2013-11-16 NOTE — ED Notes (Signed)
Pt. Reports abd. Pain started 2 wks ago when her period began.  Pt. Reports no period at present time.  Pt. Reports she was to go for a pap smear and she did not get it done.  Pt. Is to go in Oct. For her pap smear.    Pt. Reports she is having lower abd. Pain with brown vaginal discharge.  Last sexual intercourse was at 2230 tonight with no reports of pain.  Pt. Reports last BM was this am and normal.

## 2013-11-16 NOTE — ED Notes (Signed)
C/o lower abd pain x 2 weeks,  Denies burning with urination,  Having brown vag discharge

## 2013-11-18 LAB — GC/CHLAMYDIA PROBE AMP
CT Probe RNA: NEGATIVE
GC Probe RNA: NEGATIVE

## 2013-12-07 ENCOUNTER — Emergency Department (HOSPITAL_COMMUNITY)
Admission: EM | Admit: 2013-12-07 | Discharge: 2013-12-07 | Disposition: A | Discharge status: Home / Self Care | Payer: Medicaid Other | Attending: Emergency Medicine | Admitting: Emergency Medicine

## 2013-12-07 ENCOUNTER — Encounter (HOSPITAL_COMMUNITY): Payer: Self-pay | Admitting: Emergency Medicine

## 2013-12-07 DIAGNOSIS — N898 Other specified noninflammatory disorders of vagina: Secondary | ICD-10-CM | POA: Insufficient documentation

## 2013-12-07 DIAGNOSIS — N644 Mastodynia: Secondary | ICD-10-CM | POA: Diagnosis present

## 2013-12-07 DIAGNOSIS — M545 Low back pain, unspecified: Secondary | ICD-10-CM

## 2013-12-07 DIAGNOSIS — Z3202 Encounter for pregnancy test, result negative: Secondary | ICD-10-CM | POA: Insufficient documentation

## 2013-12-07 DIAGNOSIS — Z720 Tobacco use: Secondary | ICD-10-CM | POA: Insufficient documentation

## 2013-12-07 DIAGNOSIS — N6452 Nipple discharge: Secondary | ICD-10-CM

## 2013-12-07 LAB — URINALYSIS, ROUTINE W REFLEX MICROSCOPIC
BILIRUBIN URINE: NEGATIVE
Glucose, UA: NEGATIVE mg/dL
Hgb urine dipstick: NEGATIVE
Ketones, ur: NEGATIVE mg/dL
LEUKOCYTES UA: NEGATIVE
Nitrite: NEGATIVE
Protein, ur: NEGATIVE mg/dL
Specific Gravity, Urine: 1.022 (ref 1.005–1.030)
UROBILINOGEN UA: 2 mg/dL — AB (ref 0.0–1.0)
pH: 8 (ref 5.0–8.0)

## 2013-12-07 LAB — URINE MICROSCOPIC-ADD ON

## 2013-12-07 LAB — WET PREP, GENITAL
Clue Cells Wet Prep HPF POC: NONE SEEN
TRICH WET PREP: NONE SEEN
Yeast Wet Prep HPF POC: NONE SEEN

## 2013-12-07 LAB — POC URINE PREG, ED: Preg Test, Ur: NEGATIVE

## 2013-12-07 MED ORDER — TRAMADOL HCL 50 MG PO TABS: 50.0000 mg | ORAL_TABLET | Freq: Four times a day (QID) | ORAL | Status: DC | PRN

## 2013-12-07 NOTE — ED Notes (Signed)
Pt states bilateral breast pain and white colored drainage. Also states brown colored vaginal discharge. Symptoms ongoing for several days. No signs of distress noted.

## 2013-12-07 NOTE — ED Notes (Signed)
Per pt spotting, breast clear discharge and lower back pain. sts also some vaginal discharge.

## 2013-12-07 NOTE — ED Provider Notes (Signed)
CSN: [id number]     Arrival date & time 12/07/13  [id number] History   First MD Initiated Contact with Patient 12/07/13 1030     Chief Complaint  Patient presents with  . Breast Pain     (Consider location/radiation/quality/duration/timing/severity/associated sxs/prior Treatment) The history is provided by the patient.  pt states in the past few weeks noted occasional clear drainage from bil breasts if/when she massages them. Also states bilateral breasts have been mildly sore. Denies any acute or abrupt change today. No breast swelling or redness. No fever or chills. No purulent drainage. Also states low back sore for  'a while', last month. No injury. No radicular pain. No numbness/weakness. Also c/o mild vaginal discharge, clear to sl whitish, in the past 1-2 weeks. No pelvic pain. No wt loss. No malaise. Normal appetite. lnmp 1 month ago.      History reviewed. No pertinent past medical history. Past Surgical History  Procedure Laterality Date  . Cholecystectomy    . Cesarean section     History reviewed. No pertinent family history. History  Substance Use Topics  . Smoking status: Current Every Day Smoker -- 0.50 packs/day    Types: Cigarettes  . Smokeless tobacco: Not on file  . Alcohol Use: No   OB History   Grav Para Term Preterm Abortions TAB SAB Ect Mult Living                 Review of Systems  Constitutional: Negative for fever and chills.  HENT: Negative for sore throat.   Eyes: Negative for redness.  Respiratory: Negative for cough and shortness of breath.   Cardiovascular: Negative for chest pain and leg swelling.  Gastrointestinal: Negative for vomiting, abdominal pain and diarrhea.  Endocrine: Negative for polyuria.  Genitourinary: Positive for vaginal discharge. Negative for dysuria, flank pain, vaginal pain and pelvic pain.  Musculoskeletal: Negative for back pain and neck pain.  Skin: Negative for rash.  Neurological: Negative for headaches.   Hematological: Does not bruise/bleed easily.  Psychiatric/Behavioral: Negative for confusion.      Allergies  Review of patient's allergies indicates no known allergies.  Home Medications   Prior to Admission medications   Not on File   BP 128/86  Pulse 68  Temp(Src) 97.8 F (36.6 C) (Oral)  Resp 20  SpO2 100%  LMP 11/10/2013 Physical Exam  Nursing note and vitals reviewed. Constitutional: She appears well-developed and well-nourished. No distress.  HENT:  Mouth/Throat: Oropharynx is clear and moist.  Eyes: Conjunctivae are normal. No scleral icterus.  Neck: Neck supple. No tracheal deviation present.  Cardiovascular: Normal rate, regular rhythm, normal heart sounds and intact distal pulses.   Pulmonary/Chest: Effort normal and breath sounds normal. No respiratory distress. She exhibits no tenderness.  Breast symmetrical. No swelling or redness. No focal tenderness or mass. No current drainage from nipples.   Abdominal: Soft. Normal appearance and bowel sounds are normal. She exhibits no distension and no mass. There is no tenderness. There is no rebound and no guarding.  Genitourinary:  No cva tenderness. Normal ext exam. No vaginal discharge or bleeding. No cmt. Cervix closed. No adx masses/tenderness.  Musculoskeletal: She exhibits no edema.  tls spine non tender.   Neurological: She is alert.  Skin: Skin is warm and dry. No rash noted.  Psychiatric: She has a normal mood and affect.    ED Course  Procedures (including critical care time) Labs Review  Results for orders placed during the hospital encounter of 12/07/13  WET PREP, GENITAL      Result Value Ref Range   Yeast Wet Prep HPF POC NONE SEEN  NONE SEEN   Trich, Wet Prep NONE SEEN  NONE SEEN   Clue Cells Wet Prep HPF POC NONE SEEN  NONE SEEN   WBC, Wet Prep HPF POC FEW (*) NONE SEEN  URINALYSIS, ROUTINE W REFLEX MICROSCOPIC      Result Value Ref Range   Color, Urine YELLOW  YELLOW   APPearance TURBID  (*) CLEAR   Specific Gravity, Urine 1.022  1.005 - 1.030   pH 8.0  5.0 - 8.0   Glucose, UA NEGATIVE  NEGATIVE mg/dL   Hgb urine dipstick NEGATIVE  NEGATIVE   Bilirubin Urine NEGATIVE  NEGATIVE   Ketones, ur NEGATIVE  NEGATIVE mg/dL   Protein, ur NEGATIVE  NEGATIVE mg/dL   Urobilinogen, UA 2.0 (*) 0.0 - 1.0 mg/dL   Nitrite NEGATIVE  NEGATIVE   Leukocytes, UA NEGATIVE  NEGATIVE  URINE MICROSCOPIC-ADD ON      Result Value Ref Range   Squamous Epithelial / LPF RARE  RARE   WBC, UA 0-2  <3 WBC/hpf   Bacteria, UA FEW (*) RARE   Urine-Other AMORPHOUS URATES/PHOSPHATES    POC URINE PREG, ED      Result Value Ref Range   Preg Test, Ur NEGATIVE  NEGATIVE     MDM  Labs.  Reviewed nursing notes and prior charts for additional history.   No breast mass or inflammatory changes noted. No discharge.   abd soft nt. No abd or pelvic pain or tenderness.   Spine nt.  Will give rx for pain.  Pt states she does have gyn doctor with whom she will follow up closely.  Pt requests d/c to home.      Suzi RootsKevin E Jennine Peddy, MD 12/07/13 [phone]

## 2013-12-07 NOTE — Discharge Instructions (Signed)
It was our pleasure to provide your ER care today - we hope that you feel better.  Your pelvic swabs cam back negative/normal - we have sent additional culture tests which will take a day or two to result.   For back pain, take motrin or aleve as need for pain. You may also take ultram as need for pain - no driving when taking. Follow up with primary care doctor in coming week.  For breast and vaginal symptoms, follow up with your doctor/ob/gyn doctor in the next 1-2 weeks.  Return to ER if worse, new symptoms, fevers, severe abdominal or pelvic pain, persistent vomiting, intractable back pain, leg numbness/weakness, other concern.    Back Pain, Adult Low back pain is very common. About 1 in 5 people have back pain.The cause of low back pain is rarely dangerous. The pain often gets better over time.About half of people with a sudden onset of back pain feel better in just 2 weeks. About 8 in 10 people feel better by 6 weeks.  CAUSES Some common causes of back pain include:  Strain of the muscles or ligaments supporting the spine.  Wear and tear (degeneration) of the spinal discs.  Arthritis.  Direct injury to the back. DIAGNOSIS Most of the time, the direct cause of low back pain is not known.However, back pain can be treated effectively even when the exact cause of the pain is unknown.Answering your caregiver's questions about your overall health and symptoms is one of the most accurate ways to make sure the cause of your pain is not dangerous. If your caregiver needs more information, he or she may order lab work or imaging tests (X-rays or MRIs).However, even if imaging tests show changes in your back, this usually does not require surgery. HOME CARE INSTRUCTIONS For many people, back pain returns.Since low back pain is rarely dangerous, it is often a condition that people can learn to [facility] their own.   Remain active. It is stressful on the back to sit or stand in one  place. Do not sit, drive, or stand in one place for more than 30 minutes at a time. Take short walks on level surfaces as soon as pain allows.Try to increase the length of time you walk each day.  Do not stay in bed.Resting more than 1 or 2 days can delay your recovery.  Do not avoid exercise or work.Your body is made to move.It is not dangerous to be active, even though your back may hurt.Your back will likely heal faster if you return to being active before your pain is gone.  Pay attention to your body when you bend and lift. Many people have less discomfortwhen lifting if they bend their knees, keep the load close to their bodies,and avoid twisting. Often, the most comfortable positions are those that put less stress on your recovering back.  Find a comfortable position to sleep. Use a firm mattress and lie on your side with your knees slightly bent. If you lie on your back, put a pillow under your knees.  Only take over-the-counter or prescription medicines as directed by your caregiver. Over-the-counter medicines to reduce pain and inflammation are often the most helpful.Your caregiver may prescribe muscle relaxant drugs.These medicines help dull your pain so you can more quickly return to your normal activities and healthy exercise.  Put ice on the injured area.  Put ice in a plastic bag.  Place a towel between your skin and the bag.  Leave the ice  on for 15-20 minutes, 03-04 times a day for the first 2 to 3 days. After that, ice and heat may be alternated to reduce pain and spasms.  Ask your caregiver about trying back exercises and gentle massage. This may be of some benefit.  Avoid feeling anxious or stressed.Stress increases muscle tension and can worsen back pain.It is important to recognize when you are anxious or stressed and learn ways to manage it.Exercise is a great option. SEEK MEDICAL CARE IF:  You have pain that is not relieved with rest or medicine.  You  have pain that does not improve in 1 week.  You have new symptoms.  You are generally not feeling well. SEEK IMMEDIATE MEDICAL CARE IF:   You have pain that radiates from your back into your legs.  You develop new bowel or bladder control problems.  You have unusual weakness or numbness in your arms or legs.  You develop nausea or vomiting.  You develop abdominal pain.  You feel faint. Document Released: 02/07/2005 Document Revised: 08/09/2011 Document Reviewed: 06/11/2013 [facility] Patient Information 2015 [place], [place]. This information is not intended to replace advice given to you by your health care provider. Make sure you discuss any questions you have with your health care provider.    Galactorrhea Galactorrhea is when there is a milky nipple discharge. It is different from normal milk in nursing mothers. It usually comes from both nipples. Galactorrhea is not a disease but may be a symptom of a problem. It may continue for years after weaning. Galactorrhea is caused by the hormone prolactin, which stimulates milk production. If the breast discharge looks like pus, is bloody or if there is a lump present in the affected breast, the discharge may be caused by other problems including:  A benign cyst.  Papilloma.  Breast cancer.  A breast infection.  A breast abscess. It can also be seen in men who have a low or absent female hormone (testosterone) level. Galactorrhea can be present in a newborn if the mother had high female hormone (estrogen) levels that crossed into the baby through the placenta. The baby usually has enlarged breasts, but in time, it all goes away on its own. CAUSES   Tumor of the pituitary gland in the brain.  Problems with the hypothalamus in the brain that stimulates the pituitary gland.  Low thyroid function (hypothyroid disease).  Chronic kidney failure.  Medications, antidepressants, tranquilizers and blood pressure medication.  Herbal  medications (nettle, fennel, blessed thistle, anise and fenugreek seed).  Illegal drugs (marijuana and opiates).  Breast stimulation during sexual activity or too many and frequent self breast exams.  Birth control pills.  Surgery or trauma to the breast causing nerve damage.  Spinal cord injury. SYMPTOMS   White, yellow or green discharge from one or both breasts.  No menstrual period (amenorrhea) or infrequent menstrual periods (hypomenorrhea).  Hot flashes, lack of sexual desire or vaginal dryness.  Infertility in women and men.  Headaches and vision problems.  Decrease in calcium in your bones (developing osteopenia or osteoporosis). DIAGNOSIS  Your caregiver may be able to know your problem by taking a detailed history and physical exam of you. Tests that may be done, include:  Blood tests to check for the prolactin hormone, your female and thyroid hormones and a pregnancy test.  A detailed eye exam.  Mammogram.  X-rays, CT scan or MRI of breasts or your brain looking for a tumor. TREATMENT   Stopping medications that may  be causing the galactorrhea.  Treating low thyroid function with thyroid hormones.  Medical or surgical (if necessary) treatment of a pituitary gland tumor.  Medication to lower the prolactin hormone level when no cause can be found.  Surgery as a last resort to remove the breasts ducts if the discharge persists with treatment and is a problem.  Treatment may not be necessary if you are not bothered by the breast discharge. HOME CARE INSTRUCTIONS   Before seeing your caregiver, make a list of all your symptoms, medications, when the breast discharge started and questions you may have.  Avoid breast stimulation during sexual activity.  Perform breast self exam once a month.  Avoid clothes that rub on your nipples.  Use breasts pads to absorb the discharge.  Wear a support bra. SEEK MEDICAL CARE IF:   You have galactorrhea and you are  trying to get pregnant.  You develop hot flashes, vaginal dryness or lack of sexual desire.  You stop having menstrual periods or they are irregular or far apart.  You have headaches.  You have vision problems. SEEK IMMEDIATE MEDICAL CARE IF:   Your breast discharge is bloody or pus-like.  You have breast pain.  You feel a lump in your breast.  Your breast shows wrinkling or dimpling.  Your breast becomes red and swollen. Document Released: 03/17/2004 Document Revised: 05/02/2011 Document Reviewed: 01/29/2008 [facility]Exit[facility] Patient Information 2015 [place]Exit[place], [place]. This information is not intended to replace advice given to you by your health care provider. Make sure you discuss any questions you have with your health care provider.

## 2013-12-09 LAB — GC/CHLAMYDIA PROBE AMP
CT PROBE, AMP APTIMA: NEGATIVE
GC Probe RNA: NEGATIVE

## 2014-08-26 ENCOUNTER — Emergency Department (HOSPITAL_BASED_OUTPATIENT_CLINIC_OR_DEPARTMENT_OTHER)
Admission: EM | Admit: 2014-08-26 | Discharge: 2014-08-26 | Disposition: A | Discharge status: Home / Self Care | Payer: Medicaid Other | Attending: Emergency Medicine | Admitting: Emergency Medicine

## 2014-08-26 ENCOUNTER — Encounter (HOSPITAL_BASED_OUTPATIENT_CLINIC_OR_DEPARTMENT_OTHER): Payer: Self-pay

## 2014-08-26 DIAGNOSIS — Z87891 Personal history of nicotine dependence: Secondary | ICD-10-CM | POA: Diagnosis not present

## 2014-08-26 DIAGNOSIS — J039 Acute tonsillitis, unspecified: Secondary | ICD-10-CM | POA: Diagnosis not present

## 2014-08-26 DIAGNOSIS — R509 Fever, unspecified: Secondary | ICD-10-CM

## 2014-08-26 DIAGNOSIS — H9203 Otalgia, bilateral: Secondary | ICD-10-CM | POA: Insufficient documentation

## 2014-08-26 DIAGNOSIS — J029 Acute pharyngitis, unspecified: Secondary | ICD-10-CM | POA: Diagnosis present

## 2014-08-26 LAB — RAPID STREP SCREEN (MED CTR MEBANE ONLY): Streptococcus, Group A Screen (Direct): POSITIVE — AB

## 2014-08-26 MED ORDER — PENICILLIN G BENZATHINE 1200000 UNIT/2ML IM SUSP
1.2000 10*6.[IU] | Freq: Once | INTRAMUSCULAR | Status: AC
  Administered 2014-08-26: 1.2 10*6.[IU] via INTRAMUSCULAR
  Filled 2014-08-26: qty 2

## 2014-08-26 MED ORDER — ACETAMINOPHEN 500 MG PO TABS
1000.0000 mg | ORAL_TABLET | Freq: Once | ORAL | Status: AC
  Administered 2014-08-26: 1000 mg via ORAL
  Filled 2014-08-26: qty 2

## 2014-08-26 NOTE — Discharge Instructions (Signed)
You were treated today for strep throat. Rest and stay well hydrated. Take ibuprofen of tylenol for fever.  Strep Throat Strep throat is an infection of the throat caused by a bacteria named Streptococcus pyogenes. Your health care provider may call the infection streptococcal "tonsillitis" or "pharyngitis" depending on whether there are signs of inflammation in the tonsils or back of the throat. Strep throat is most common in children aged 30-15 years during the cold months of the year, but it can occur in people of any age during any season. This infection is spread from person to person (contagious) through coughing, sneezing, or other close contact. SIGNS AND SYMPTOMS   Fever or chills.  Painful, swollen, red tonsils or throat.  Pain or difficulty when swallowing.  White or yellow spots on the tonsils or throat.  Swollen, tender lymph nodes or "glands" of the neck or under the jaw.  Red rash all over the body (rare). DIAGNOSIS  Many different infections can cause the same symptoms. A test must be done to confirm the diagnosis so the right treatment can be given. A "rapid strep test" can help your health care provider make the diagnosis in a few minutes. If this test is not available, a light swab of the infected area can be used for a throat culture test. If a throat culture test is done, results are usually available in a day or two. TREATMENT  Strep throat is treated with antibiotic medicine. HOME CARE INSTRUCTIONS   Gargle with 1 tsp of salt in 1 cup of warm water, 3-4 times per day or as needed for comfort.  Family members who also have a sore throat or fever should be tested for strep throat and treated with antibiotics if they have the strep infection.  Make sure everyone in your household washes their hands well.  Do not share food, drinking cups, or personal items that could cause the infection to spread to others.  You may need to eat a soft food diet until your sore throat  gets better.  Drink enough water and fluids to keep your urine clear or pale yellow. This will help prevent dehydration.  Get plenty of rest.  Stay home from school, day care, or work until you have been on antibiotics for 24 hours.  Take medicines only as directed by your health care provider.  Take your antibiotic medicine as directed by your health care provider. Finish it even if you start to feel better. SEEK MEDICAL CARE IF:   The glands in your neck continue to enlarge.  You develop a rash, cough, or earache.  You cough up green, yellow-brown, or bloody sputum.  You have pain or discomfort not controlled by medicines.  Your problems seem to be getting worse rather than better.  You have a fever. SEEK IMMEDIATE MEDICAL CARE IF:   You develop any new symptoms such as vomiting, severe headache, stiff or painful neck, chest pain, shortness of breath, or trouble swallowing.  You develop severe throat pain, drooling, or changes in your voice.  You develop swelling of the neck, or the skin on the neck becomes red and tender.  You develop signs of dehydration, such as fatigue, dry mouth, and decreased urination.  You become increasingly sleepy, or you cannot wake up completely. MAKE SURE YOU:  Understand these instructions.  Will watch your condition.  Will get help right away if you are not doing well or get worse. Document Released: 02/05/2000 Document Revised: 06/24/2013 Document Reviewed:  04/08/2010 ExitCare Patient Information 2015 [place], [place]. This information is not intended to replace advice given to you by your health care provider. Make sure you discuss any questions you have with your health care provider.  Tonsillitis Tonsillitis is an infection of the throat that causes the tonsils to become red, tender, and swollen. Tonsils are collections of lymphoid tissue at the back of the throat. Each tonsil has crevices (crypts). Tonsils help fight nose and throat  infections and keep infection from spreading to other parts of the body for the first 18 months of life.  CAUSES Sudden (acute) tonsillitis is usually caused by infection with streptococcal bacteria. Long-lasting (chronic) tonsillitis occurs when the crypts of the tonsils become filled with pieces of food and bacteria, which makes it easy for the tonsils to become repeatedly infected. SYMPTOMS  Symptoms of tonsillitis include:  A sore throat, with possible difficulty swallowing.  White patches on the tonsils.  Fever.  Tiredness.  New episodes of snoring during sleep, when you did not snore before.  Small, foul-smelling, yellowish-white pieces of material (tonsilloliths) that you occasionally cough up or spit out. The tonsilloliths can also cause you to have bad breath. DIAGNOSIS Tonsillitis can be diagnosed through a physical exam. Diagnosis can be confirmed with the results of lab tests, including a throat culture. TREATMENT  The goals of tonsillitis treatment include the reduction of the severity and duration of symptoms and prevention of associated conditions. Symptoms of tonsillitis can be improved with the use of steroids to reduce the swelling. Tonsillitis caused by bacteria can be treated with antibiotic medicines. Usually, treatment with antibiotic medicines is started before the cause of the tonsillitis is known. However, if it is determined that the cause is not bacterial, antibiotic medicines will not treat the tonsillitis. If attacks of tonsillitis are severe and frequent, your health care provider may recommend surgery to remove the tonsils (tonsillectomy). HOME CARE INSTRUCTIONS   Rest as much as possible and get plenty of sleep.  Drink plenty of fluids. While the throat is very sore, eat soft foods or liquids, such as sherbet, soups, or instant breakfast drinks.  Eat frozen ice pops.  Gargle with a warm or cold liquid to help soothe the throat. Mix 1/4 teaspoon of salt and  1/4 teaspoon of baking soda in 8 oz of water. SEEK MEDICAL CARE IF:   Large, tender lumps develop in your neck.  A rash develops.  A green, yellow-brown, or bloody substance is coughed up.  You are unable to swallow liquids or food for 24 hours.  You notice that only one of the tonsils is swollen. SEEK IMMEDIATE MEDICAL CARE IF:   You develop any new symptoms such as vomiting, severe headache, stiff neck, chest pain, or trouble breathing or swallowing.  You have severe throat pain along with drooling or voice changes.  You have severe pain, unrelieved with recommended medications.  You are unable to fully open the mouth.  You develop redness, swelling, or severe pain anywhere in the neck.  You have a fever. MAKE SURE YOU:   Understand these instructions.  Will watch your condition.  Will get help right away if you are not doing well or get worse. Document Released: 11/17/2004 Document Revised: 06/24/2013 Document Reviewed: 07/27/2012 [facility] Patient Information 2015 [place], [place]. This information is not intended to replace advice given to you by your health care provider. Make sure you discuss any questions you have with your health care provider. Salt Water Gargle This solution  will help make your mouth and throat feel better. HOME CARE INSTRUCTIONS   Mix 1 teaspoon of salt in 8 ounces of warm water.  Gargle with this solution as much or often as you need or as directed. Swish and gargle gently if you have any sores or wounds in your mouth.  Do not swallow this mixture. Document Released: 11/12/2003 Document Revised: 05/02/2011 Document Reviewed: 04/04/2008 [facility] LLCExitCare Patient Information 2015 [place], [place]. This information is not intended to replace advice given to you by your health care provider. Make sure you discuss any questions you have with your health care provider.

## 2014-08-26 NOTE — ED Notes (Addendum)
Pt reports 2 days of body aches, sore throat and cough.  Subjective fever. Also with reported pain in bilateral ears.

## 2014-08-26 NOTE — ED Provider Notes (Signed)
CSN: [id number]     Arrival date & time 08/26/14  1330 History   First MD Initiated Contact with Patient 08/26/14 1335     Chief Complaint  Patient presents with  . Sore Throat     (Consider location/radiation/quality/duration/timing/severity/associated sxs/prior Treatment) HPI Comments: 29 year old female complaining of sore throat 2 days. Pain increases with swallowing, unrelieved by ibuprofen. Admits to associated generalized body aches, bilateral ear pain and subjective fever. Her children have recently been sick with similar symptoms. States she is coughing up mucus from the back of her throat since her throat started hurting.  Patient is a 29 y.o. female presenting with pharyngitis. The history is provided by the patient.  Sore Throat This is a new problem. The current episode started yesterday. The problem occurs constantly. The problem has been gradually worsening. Associated symptoms include arthralgias, a fever, myalgias and a sore throat. The symptoms are aggravated by swallowing. She has tried NSAIDs for the symptoms. The treatment provided no relief.    History reviewed. No pertinent past medical history. Past Surgical History  Procedure Laterality Date  . Cholecystectomy    . Cesarean section     No family history on file. History  Substance Use Topics  . Smoking status: Former Smoker -- 0.50 packs/day    Types: Cigarettes  . Smokeless tobacco: Not on file  . Alcohol Use: No   OB History    No data available     Review of Systems  Constitutional: Positive for fever.  HENT: Positive for ear pain and sore throat.   Musculoskeletal: Positive for myalgias and arthralgias.  All other systems reviewed and are negative.     Allergies  Review of patient's allergies indicates no known allergies.  Home Medications   Prior to Admission medications   Medication Sig Start Date End Date Taking? Authorizing Provider  traMADol (ULTRAM) 50 MG tablet Take 1 tablet (50  mg total) by mouth every 6 (six) hours as needed. 12/07/13   Cathren LaineKevin Steinl, MD   BP 116/63 mmHg  Pulse 101  Temp(Src) 102.5 F (39.2 C) (Oral)  Resp 22  Ht 5\' 4"  (1.626 m)  SpO2 100%  LMP 07/26/2014 Physical Exam  Constitutional: She is oriented to person, place, and time. She appears well-developed and well-nourished. No distress.  HENT:  Head: Normocephalic and atraumatic.  Right Ear: Tympanic membrane normal.  Left Ear: Tympanic membrane normal.  Nose: Mucosal edema present.  Tonsils enlarged and inflamed +3 BL with BL exudate. Uvula midline.  Eyes: Conjunctivae and EOM are normal.  Neck: Normal range of motion. Neck supple.  Cardiovascular: Normal rate, regular rhythm and normal heart sounds.   Pulmonary/Chest: Effort normal and breath sounds normal. No respiratory distress.  Musculoskeletal: Normal range of motion. She exhibits no edema.  Lymphadenopathy:       Head (right side): Tonsillar adenopathy present.       Head (left side): Tonsillar adenopathy present.    She has cervical adenopathy.       Right cervical: Superficial cervical adenopathy present.       Left cervical: Superficial cervical adenopathy present.  Neurological: She is alert and oriented to person, place, and time. No sensory deficit.  Skin: Skin is warm and dry.  Psychiatric: She has a normal mood and affect. Her behavior is normal.  Nursing note and vitals reviewed.   ED Course  Procedures (including critical care time) Labs Review Labs Reviewed  RAPID STREP SCREEN (NOT AT [facility])    Imaging Review  No results found.   EKG Interpretation None      MDM   Final diagnoses:  Tonsillitis with exudate  Fever and chills   Nontoxic appearing, NAD. Afebrile 102.5 on arrival. Tylenol given. Vitals otherwise stable. Heart rate noted to be 101 on arrival vitals, no tachycardia on my exam. Rapid strep positive. Treat with Bicillin. No tonsillar abscess. Swallows secretions well. Advised rest,  hydration, Tylenol/ibuprofen for fever, salt water gargles. Follow-up with PCP. Stable for discharge. Return precautions given. Patient states understanding of treatment care plan and is agreeable.   Kathrynn Speed, PA-C 08/26/14 1403  Kathrynn Speed, PA-C 08/26/14 1422  Raeford Razor, MD 08/27/14 [phone]

## 2014-11-05 ENCOUNTER — Emergency Department (HOSPITAL_BASED_OUTPATIENT_CLINIC_OR_DEPARTMENT_OTHER)
Admission: EM | Admit: 2014-11-05 | Discharge: 2014-11-05 | Disposition: A | Discharge status: Home / Self Care | Payer: Medicaid Other | Attending: Emergency Medicine | Admitting: Emergency Medicine

## 2014-11-05 ENCOUNTER — Encounter (HOSPITAL_BASED_OUTPATIENT_CLINIC_OR_DEPARTMENT_OTHER): Payer: Self-pay | Admitting: *Deleted

## 2014-11-05 DIAGNOSIS — Z87891 Personal history of nicotine dependence: Secondary | ICD-10-CM | POA: Insufficient documentation

## 2014-11-05 DIAGNOSIS — Z3202 Encounter for pregnancy test, result negative: Secondary | ICD-10-CM | POA: Insufficient documentation

## 2014-11-05 DIAGNOSIS — H73893 Other specified disorders of tympanic membrane, bilateral: Secondary | ICD-10-CM | POA: Diagnosis not present

## 2014-11-05 DIAGNOSIS — J069 Acute upper respiratory infection, unspecified: Secondary | ICD-10-CM | POA: Diagnosis not present

## 2014-11-05 DIAGNOSIS — R05 Cough: Secondary | ICD-10-CM | POA: Diagnosis present

## 2014-11-05 LAB — PREGNANCY, URINE: Preg Test, Ur: NEGATIVE

## 2014-11-05 NOTE — Discharge Instructions (Signed)
Upper Respiratory Infection, Adult An upper respiratory infection (URI) is also sometimes known as the common cold. The upper respiratory tract includes the nose, sinuses, throat, trachea, and bronchi. Bronchi are the airways leading to the lungs. Most people improve within 1 week, but symptoms can last up to 2 weeks. A residual cough may last even longer.  CAUSES Many different viruses can infect the tissues lining the upper respiratory tract. The tissues become irritated and inflamed and often become very moist. Mucus production is also common. A cold is contagious. You can easily spread the virus to others by oral contact. This includes kissing, sharing a glass, coughing, or sneezing. Touching your mouth or nose and then touching a surface, which is then touched by another person, can also spread the virus. SYMPTOMS  Symptoms typically develop 1 to 3 days after you come in contact with a cold virus. Symptoms vary from person to person. They may include:  Runny nose.  Sneezing.  Nasal congestion.  Sinus irritation.  Sore throat.  Loss of voice (laryngitis).  Cough.  Fatigue.  Muscle aches.  Loss of appetite.  Headache.  Low-grade fever. DIAGNOSIS  You might diagnose your own cold based on familiar symptoms, since most people get a cold 2 to 3 times a year. Your caregiver can confirm this based on your exam. Most importantly, your caregiver can check that your symptoms are not due to another disease such as strep throat, sinusitis, pneumonia, asthma, or epiglottitis. Blood tests, throat tests, and X-rays are not necessary to diagnose a common cold, but they may sometimes be helpful in excluding other more serious diseases. Your caregiver will decide if any further tests are required. RISKS AND COMPLICATIONS  You may be at risk for a more severe case of the common cold if you smoke cigarettes, have chronic heart disease (such as heart failure) or lung disease (such as asthma), or if  you have a weakened immune system. The very young and very old are also at risk for more serious infections. Bacterial sinusitis, middle ear infections, and bacterial pneumonia can complicate the common cold. The common cold can worsen asthma and chronic obstructive pulmonary disease (COPD). Sometimes, these complications can require emergency medical care and may be life-threatening. PREVENTION  The best way to protect against getting a cold is to practice good hygiene. Avoid oral or hand contact with people with cold symptoms. Wash your hands often if contact occurs. There is no clear evidence that vitamin C, vitamin E, echinacea, or exercise reduces the chance of developing a cold. However, it is always recommended to get plenty of rest and practice good nutrition. TREATMENT  Treatment is directed at relieving symptoms. There is no cure. Antibiotics are not effective, because the infection is caused by a virus, not by bacteria. Treatment may include:  Increased fluid intake. Sports drinks offer valuable electrolytes, sugars, and fluids.  Breathing heated mist or steam (vaporizer or shower).  Eating chicken soup or other clear broths, and maintaining good nutrition.  Getting plenty of rest.  Using gargles or lozenges for comfort.  Controlling fevers with ibuprofen or acetaminophen as directed by your caregiver.  Increasing usage of your inhaler if you have asthma. Zinc gel and zinc lozenges, taken in the first 24 hours of the common cold, can shorten the duration and lessen the severity of symptoms. Pain medicines may help with fever, muscle aches, and throat pain. A variety of non-prescription medicines are available to treat congestion and runny nose. Your caregiver   can make recommendations and may suggest nasal or lung inhalers for other symptoms.  HOME CARE INSTRUCTIONS   Only take over-the-counter or prescription medicines for pain, discomfort, or fever as directed by your  caregiver.  Use a warm mist humidifier or inhale steam from a shower to increase air moisture. This may keep secretions moist and make it easier to breathe.  Drink enough water and fluids to keep your urine clear or pale yellow.  Rest as needed.  Return to work when your temperature has returned to normal or as your caregiver advises. You may need to stay home longer to avoid infecting others. You can also use a face mask and careful hand washing to prevent spread of the virus. SEEK MEDICAL CARE IF:   After the first few days, you feel you are getting worse rather than better.  You need your caregiver's advice about medicines to control symptoms.  You develop chills, worsening shortness of breath, or brown or red sputum. These may be signs of pneumonia.  You develop yellow or brown nasal discharge or pain in the face, especially when you bend forward. These may be signs of sinusitis.  You develop a fever, swollen neck glands, pain with swallowing, or white areas in the back of your throat. These may be signs of strep throat. SEEK IMMEDIATE MEDICAL CARE IF:   You have a fever.  You develop severe or persistent headache, ear pain, sinus pain, or chest pain.  You develop wheezing, a prolonged cough, cough up blood, or have a change in your usual mucus (if you have chronic lung disease).  You develop sore muscles or a stiff neck. Document Released: 08/03/2000 Document Revised: 05/02/2011 Document Reviewed: 05/15/2013 Exit[employer] Patient Information 2015 [employer]. This information is not intended to replace advice given to you by your health care provider. Make sure you discuss any questions you have with your health care provider.  

## 2014-11-05 NOTE — ED Notes (Addendum)
Patient complains of uri, productive cough, body aches, congestion, no n/v, no fever

## 2014-11-05 NOTE — ED Provider Notes (Signed)
CSN: [id number]     Arrival date & time 11/05/14  0908 History   First MD Initiated Contact with Patient 11/05/14 [phone]     Chief Complaint  Patient presents with  . URI     (Consider location/radiation/quality/duration/timing/severity/associated sxs/prior Treatment) Patient is a 29 y.o. female presenting with URI.  URI Presenting symptoms: congestion, cough, ear pain, fatigue, rhinorrhea and sore throat   Presenting symptoms: no fever   Associated symptoms: headaches and myalgias    patient presents with nasal congestion sore throat and cough. States she feels bad all over. States she's had this for the last 4 days. Recently was on a cruise to the [place] for her honeymoon. No dysuria. No sick contacts. Has had chills without fevers. Slight headache. Pain is dull and constant.  History reviewed. No pertinent past medical history. Past Surgical History  Procedure Laterality Date  . Cholecystectomy    . Cesarean section     No family history on file. Social History  Substance Use Topics  . Smoking status: Former Smoker -- 0.50 packs/day    Types: Cigarettes  . Smokeless tobacco: None  . Alcohol Use: No   OB History    No data available     Review of Systems  Constitutional: Positive for chills, appetite change and fatigue. Negative for fever.  HENT: Positive for congestion, ear pain, rhinorrhea and sore throat. Negative for hearing loss and nosebleeds.   Respiratory: Positive for cough. Negative for shortness of breath.   Cardiovascular: Negative for chest pain.  Gastrointestinal: Negative for abdominal pain.  Genitourinary: Negative for dysuria.  Musculoskeletal: Positive for myalgias. Negative for back pain.  Neurological: Positive for headaches. Negative for weakness.  Hematological: Positive for adenopathy.      Allergies  Review of patient's allergies indicates no known allergies.  Home Medications   Prior to Admission medications   Medication Sig Start  Date End Date Taking? Authorizing Provider  traMADol (ULTRAM) 50 MG tablet Take 1 tablet (50 mg total) by mouth every 6 (six) hours as needed. 12/07/13   Cathren Laine, MD   BP 128/77 mmHg  Pulse 78  Temp(Src) 98 F (36.7 C) (Oral)  Resp 18  SpO2 98% Physical Exam  Constitutional: She appears well-developed.  HENT:  Mild posterior pharyngeal erythema without exudate. Slight pinkness of bilateral TMs without bulging.  Eyes: EOM are normal.  Neck: Neck supple.  Cardiovascular: Normal rate.   Pulmonary/Chest: Effort normal. No respiratory distress. She has no wheezes. She has no rales.  Abdominal: Soft.  Musculoskeletal: Normal range of motion.  Lymphadenopathy:    She has cervical adenopathy.  Neurological: She is alert.  Skin: Skin is warm.    ED Course  Procedures (including critical care time) Labs Review Labs Reviewed  PREGNANCY, URINE    Imaging Review No results found. I have personally reviewed and evaluated these images and lab results as part of my medical decision-making.   EKG Interpretation None      MDM   Final diagnoses:  URI (upper respiratory infection)    Patient with URI. Does not appear to be pneumonia. Doubt strep. Will discharge.    Benjiman Core, MD 11/06/14 [phone]

## 2015-06-15 ENCOUNTER — Encounter (HOSPITAL_BASED_OUTPATIENT_CLINIC_OR_DEPARTMENT_OTHER): Payer: Self-pay | Admitting: *Deleted

## 2015-06-15 ENCOUNTER — Other Ambulatory Visit: Payer: Self-pay

## 2015-06-15 ENCOUNTER — Emergency Department (HOSPITAL_BASED_OUTPATIENT_CLINIC_OR_DEPARTMENT_OTHER)
Admission: EM | Admit: 2015-06-15 | Discharge: 2015-06-15 | Disposition: A | Discharge status: Home / Self Care | Payer: Medicaid Other | Attending: Emergency Medicine | Admitting: Emergency Medicine

## 2015-06-15 DIAGNOSIS — R072 Precordial pain: Secondary | ICD-10-CM | POA: Diagnosis present

## 2015-06-15 DIAGNOSIS — Z87891 Personal history of nicotine dependence: Secondary | ICD-10-CM | POA: Insufficient documentation

## 2015-06-15 DIAGNOSIS — R110 Nausea: Secondary | ICD-10-CM | POA: Diagnosis not present

## 2015-06-15 DIAGNOSIS — R0789 Other chest pain: Secondary | ICD-10-CM

## 2015-06-15 LAB — URINE MICROSCOPIC-ADD ON
RBC / HPF: NONE SEEN RBC/hpf (ref 0–5)
WBC, UA: NONE SEEN WBC/hpf (ref 0–5)

## 2015-06-15 LAB — CBC WITH DIFFERENTIAL/PLATELET
BASOS PCT: 0 %
Basophils Absolute: 0 10*3/uL (ref 0.0–0.1)
EOS ABS: 0 10*3/uL (ref 0.0–0.7)
EOS PCT: 0 %
HEMATOCRIT: 38.8 % (ref 36.0–46.0)
HEMOGLOBIN: 12.9 g/dL (ref 12.0–15.0)
LYMPHS PCT: 29 %
Lymphs Abs: 2.5 10*3/uL (ref 0.7–4.0)
MCH: 27.5 pg (ref 26.0–34.0)
MCHC: 33.2 g/dL (ref 30.0–36.0)
MCV: 82.7 fL (ref 78.0–100.0)
MONOS PCT: 17 %
Monocytes Absolute: 1.5 10*3/uL — ABNORMAL HIGH (ref 0.1–1.0)
NEUTROS ABS: 4.7 10*3/uL (ref 1.7–7.7)
NEUTROS PCT: 54 %
Platelets: 208 10*3/uL (ref 150–400)
RBC: 4.69 MIL/uL (ref 3.87–5.11)
RDW: 14.7 % (ref 11.5–15.5)
WBC: 8.7 10*3/uL (ref 4.0–10.5)

## 2015-06-15 LAB — BASIC METABOLIC PANEL
ANION GAP: 11 (ref 5–15)
BUN: 16 mg/dL (ref 6–20)
CHLORIDE: 103 mmol/L (ref 101–111)
CO2: 24 mmol/L (ref 22–32)
Calcium: 9.1 mg/dL (ref 8.9–10.3)
Creatinine, Ser: 0.75 mg/dL (ref 0.44–1.00)
GFR calc Af Amer: >60 mL/min (ref >60)
GLUCOSE: 83 mg/dL (ref 65–99)
POTASSIUM: 3.8 mmol/L (ref 3.5–5.1)
Sodium: 138 mmol/L (ref 135–145)

## 2015-06-15 LAB — PREGNANCY, URINE: PREG TEST UR: NEGATIVE

## 2015-06-15 LAB — URINALYSIS, ROUTINE W REFLEX MICROSCOPIC
Bilirubin Urine: NEGATIVE
Glucose, UA: NEGATIVE mg/dL
Hgb urine dipstick: NEGATIVE
KETONES UR: NEGATIVE mg/dL
LEUKOCYTES UA: NEGATIVE
NITRITE: NEGATIVE
PH: 8 (ref 5.0–8.0)
Protein, ur: NEGATIVE mg/dL
SPECIFIC GRAVITY, URINE: 1.023 (ref 1.005–1.030)

## 2015-06-15 LAB — D-DIMER, QUANTITATIVE: D-Dimer, Quant: <0.27 ug/mL-FEU (ref 0.00–0.50)

## 2015-06-15 LAB — TROPONIN I

## 2015-06-15 NOTE — Discharge Instructions (Signed)
Nonspecific Chest Pain  °Chest pain can be caused by many different conditions. There is always a chance that your pain could be related to something serious, such as a heart attack or a blood clot in your lungs. Chest pain can also be caused by conditions that are not life-threatening. If you have chest pain, it is very important to follow up with your health care provider. °CAUSES  °Chest pain can be caused by: °· Heartburn. °· Pneumonia or bronchitis. °· Anxiety or stress. °· Inflammation around your heart (pericarditis) or lung (pleuritis or pleurisy). °· A blood clot in your lung. °· A collapsed lung (pneumothorax). It can develop suddenly on its own (spontaneous pneumothorax) or from trauma to the chest. °· Shingles infection (varicella-zoster virus). °· Heart attack. °· Damage to the bones, muscles, and cartilage that make up your chest wall. This can include: °¨ Bruised bones due to injury. °¨ Strained muscles or cartilage due to frequent or repeated coughing or overwork. °¨ Fracture to one or more ribs. °¨ Sore cartilage due to inflammation (costochondritis). °RISK FACTORS  °Risk factors for chest pain may include: °· Activities that increase your risk for trauma or injury to your chest. °· Respiratory infections or conditions that cause frequent coughing. °· Medical conditions or overeating that can cause heartburn. °· Heart disease or family history of heart disease. °· Conditions or health behaviors that increase your risk of developing a blood clot. °· Having had chicken pox (varicella zoster). °SIGNS AND SYMPTOMS °Chest pain can feel like: °· Burning or tingling on the surface of your chest or deep in your chest. °· Crushing, pressure, aching, or squeezing pain. °· Dull or sharp pain that is worse when you move, cough, or take a deep breath. °· Pain that is also felt in your back, neck, shoulder, or arm, or pain that spreads to any of these areas. °Your chest pain may come and go, or it may stay  constant. °DIAGNOSIS °Lab tests or other studies may be needed to find the cause of your pain. Your health care provider may have you take a test called an ambulatory ECG (electrocardiogram). An ECG records your heartbeat patterns at the time the test is performed. You may also have other tests, such as: °· Transthoracic echocardiogram (TTE). During echocardiography, sound waves are used to create a picture of all of the heart structures and to look at how blood flows through your heart. °· Transesophageal echocardiogram (TEE). This is a more advanced imaging test that obtains images from inside your body. It allows your health care provider to see your heart in finer detail. °· Cardiac monitoring. This allows your health care provider to monitor your heart rate and rhythm in real time. °· Holter monitor. This is a portable device that records your heartbeat and can help to diagnose abnormal heartbeats. It allows your health care provider to track your heart activity for several days, if needed. °· Stress tests. These can be done through exercise or by taking medicine that makes your heart beat more quickly. °· Blood tests. °· Imaging tests. °TREATMENT  °Your treatment depends on what is causing your chest pain. Treatment may include: °· Medicines. These may include: °¨ Acid blockers for heartburn. °¨ Anti-inflammatory medicine. °¨ Pain medicine for inflammatory conditions. °¨ Antibiotic medicine, if an infection is present. °¨ Medicines to dissolve blood clots. °¨ Medicines to treat coronary artery disease. °· Supportive care for conditions that do not require medicines. This may include: °¨ Resting. °¨ Applying heat   or cold packs to injured areas. °¨ Limiting activities until pain decreases. °HOME CARE INSTRUCTIONS °· If you were prescribed an antibiotic medicine, finish it all even if you start to feel better. °· Avoid any activities that bring on chest pain. °· Do not use any tobacco products, including  cigarettes, chewing tobacco, or electronic cigarettes. If you need help quitting, ask your health care provider. °· Do not drink alcohol. °· Take medicines only as directed by your health care provider. °· Keep all follow-up visits as directed by your health care provider. This is important. This includes any further testing if your chest pain does not go away. °· If heartburn is the cause for your chest pain, you may be told to keep your head raised (elevated) while sleeping. This reduces the chance that acid will go from your stomach into your esophagus. °· Make lifestyle changes as directed by your health care provider. These may include: °¨ Getting regular exercise. Ask your health care provider to suggest some activities that are safe for you. °¨ Eating a heart-healthy diet. A registered dietitian can help you to learn healthy eating options. °¨ Maintaining a healthy weight. °¨ Managing diabetes, if necessary. °¨ Reducing stress. °SEEK MEDICAL CARE IF: °· Your chest pain does not go away after treatment. °· You have a rash with blisters on your chest. °· You have a fever. °SEEK IMMEDIATE MEDICAL CARE IF:  °· Your chest pain is worse. °· You have an increasing cough, or you cough up blood. °· You have severe abdominal pain. °· You have severe weakness. °· You faint. °· You have chills. °· You have sudden, unexplained chest discomfort. °· You have sudden, unexplained discomfort in your arms, back, neck, or jaw. °· You have shortness of breath at any time. °· You suddenly start to sweat, or your skin gets clammy. °· You feel nauseous or you vomit. °· You suddenly feel light-headed or dizzy. °· Your heart begins to beat quickly, or it feels like it is skipping beats. °These symptoms may represent a serious problem that is an emergency. Do not wait to see if the symptoms will go away. Get medical help right away. Call your local emergency services (911 in the [place] Do not drive yourself to the hospital. °  °This  information is not intended to replace advice given to you by your health care provider. Make sure you discuss any questions you have with your health care provider. °  °Document Released: 11/17/2004 Document Revised: 02/28/2014 Document Reviewed: 09/13/2013 °Elsevier Interactive Patient Education ©2016 [employer] ° °

## 2015-06-15 NOTE — ED Provider Notes (Signed)
CSN: [id number]     Arrival date & time 06/15/15  1602 History   First MD Initiated Contact with Patient 06/15/15 1613     Chief Complaint  Patient presents with  . Chest Pain     (Consider location/radiation/quality/duration/timing/severity/associated sxs/prior Treatment) Patient is a 30 y.o. female presenting with chest pain. The history is provided by the patient. No language interpreter was used.  Chest Pain Pain location:  Substernal area Pain quality: shooting and throbbing   Pain radiates to:  Does not radiate Pain radiates to the back: no   Pain severity:  Mild Onset quality:  Gradual Duration:  2 days Timing:  Intermittent Progression:  Waxing and waning Chronicity:  New Context: breathing and movement   Worsened by:  Certain positions and deep breathing Ineffective treatments:  None tried Associated symptoms: nausea and shortness of breath   Associated symptoms: no abdominal pain   Risk factors: no birth control     History reviewed. No pertinent past medical history. Past Surgical History  Procedure Laterality Date  . Cholecystectomy    . Cesarean section     No family history on file. Social History  Substance Use Topics  . Smoking status: Former Smoker -- 0.50 packs/day    Types: Cigarettes  . Smokeless tobacco: None  . Alcohol Use: No   OB History    No data available     Review of Systems  Respiratory: Positive for shortness of breath.   Cardiovascular: Positive for chest pain.  Gastrointestinal: Positive for nausea. Negative for abdominal pain.  Skin: Negative for rash.  All other systems reviewed and are negative.     Allergies  Review of patient's allergies indicates no known allergies.  Home Medications   Prior to Admission medications   Medication Sig Start Date End Date Taking? Authorizing Provider  traMADol (ULTRAM) 50 MG tablet Take 1 tablet (50 mg total) by mouth every 6 (six) hours as needed. 12/07/13   Cathren Laine, MD   BP  125/84 mmHg  Pulse 75  Temp(Src) 98.1 F (36.7 C) (Oral)  Resp 20  Ht  (1.626 m)  Wt 95.255 kg  BMI 36.03 kg/m2  SpO2 100%  LMP 05/25/2015 Physical Exam  Constitutional: She is oriented to person, place, and time. She appears well-developed and well-nourished.  HENT:  Head: Normocephalic.  Eyes: Conjunctivae are normal.  Neck: Neck supple.  Cardiovascular: Normal rate and regular rhythm.   Pulmonary/Chest: Effort normal and breath sounds normal. She has no wheezes. She has no rales.  Abdominal: Soft. Bowel sounds are normal.  Musculoskeletal: She exhibits no edema or tenderness.  Lymphadenopathy:    She has no cervical adenopathy.  Neurological: She is alert and oriented to person, place, and time.  Skin: Skin is warm and dry.  Psychiatric: She has a normal mood and affect.  Nursing note and vitals reviewed.   ED Course  Procedures (including critical care time) Labs Review Labs Reviewed  URINALYSIS, ROUTINE W REFLEX MICROSCOPIC (NOT AT [facility]) - Abnormal; Notable for the following:    APPearance TURBID (*)    All other components within normal limits  URINE MICROSCOPIC-ADD ON - Abnormal; Notable for the following:    Squamous Epithelial / LPF 0-5 (*)    Bacteria, UA RARE (*)    All other components within normal limits  CBC WITH DIFFERENTIAL/PLATELET - Abnormal; Notable for the following:    Monocytes Absolute 1.5 (*)    All other components within normal limits  PREGNANCY,  URINE  TROPONIN I  BASIC METABOLIC PANEL  D-DIMER, QUANTITATIVE (NOT AT [facility])  CBC WITH DIFFERENTIAL/PLATELET    Imaging Review No results found. I have personally reviewed and evaluated these images and lab results as part of my medical decision-making.   EKG Interpretation None     EKG: NSR, non-specific t wave abnormality. No ischemic changes.   MDM   Final diagnoses:  None  Patient with atypical chest pain. Pain worse with certain movements, but is not reproducible with  palpation or breathing. No shortness of breath or cough. Lungs CTA bilaterally. No ischemia on ECG. Normal d-dimer and troponin. Low suspicion of cardiopulmonary etiology. Symptomatic care instructions provided. Return precautions discussed. Follow-up with PCP.      Felicie Mornavid Cortnee Steinmiller, NP 06/16/15 0112  Loren Raceravid Yelverton, MD 06/22/15 [phone]

## 2015-06-15 NOTE — ED Notes (Signed)
Chest pain in her upper chest x 2 days. Worse with she moves a certain way. Feels like pins and needles per pt.

## 2015-12-30 ENCOUNTER — Emergency Department (HOSPITAL_BASED_OUTPATIENT_CLINIC_OR_DEPARTMENT_OTHER)
Admission: EM | Admit: 2015-12-30 | Discharge: 2015-12-30 | Disposition: A | Discharge status: Home / Self Care | Payer: No Typology Code available for payment source | Attending: Emergency Medicine | Admitting: Emergency Medicine

## 2015-12-30 ENCOUNTER — Emergency Department (HOSPITAL_BASED_OUTPATIENT_CLINIC_OR_DEPARTMENT_OTHER): Payer: No Typology Code available for payment source

## 2015-12-30 ENCOUNTER — Encounter (HOSPITAL_BASED_OUTPATIENT_CLINIC_OR_DEPARTMENT_OTHER): Payer: Self-pay | Admitting: Emergency Medicine

## 2015-12-30 DIAGNOSIS — Y999 Unspecified external cause status: Secondary | ICD-10-CM | POA: Diagnosis not present

## 2015-12-30 DIAGNOSIS — R1084 Generalized abdominal pain: Secondary | ICD-10-CM | POA: Diagnosis not present

## 2015-12-30 DIAGNOSIS — O9A211 Injury, poisoning and certain other consequences of external causes complicating pregnancy, first trimester: Secondary | ICD-10-CM | POA: Diagnosis present

## 2015-12-30 DIAGNOSIS — Z87891 Personal history of nicotine dependence: Secondary | ICD-10-CM | POA: Insufficient documentation

## 2015-12-30 DIAGNOSIS — Z3A10 10 weeks gestation of pregnancy: Secondary | ICD-10-CM | POA: Diagnosis not present

## 2015-12-30 DIAGNOSIS — Y92410 Unspecified street and highway as the place of occurrence of the external cause: Secondary | ICD-10-CM | POA: Insufficient documentation

## 2015-12-30 DIAGNOSIS — O26891 Other specified pregnancy related conditions, first trimester: Secondary | ICD-10-CM | POA: Insufficient documentation

## 2015-12-30 DIAGNOSIS — Y939 Activity, unspecified: Secondary | ICD-10-CM | POA: Diagnosis not present

## 2015-12-30 DIAGNOSIS — Z79899 Other long term (current) drug therapy: Secondary | ICD-10-CM | POA: Diagnosis not present

## 2015-12-30 DIAGNOSIS — Z3490 Encounter for supervision of normal pregnancy, unspecified, unspecified trimester: Secondary | ICD-10-CM

## 2015-12-30 NOTE — ED Notes (Signed)
Patient transported to Ultrasound 
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
## 2015-12-30 NOTE — Discharge Instructions (Addendum)
You can take Tylenol as prescribed over-the-counter as needed for your pain. Patient to follow-up with your OB/GYN as soon as possible. At the very least, please call the office tomorrow. Please return to emergency department if you develop any new or worsening symptoms.
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
## 2015-12-30 NOTE — ED Notes (Signed)
ED Provider at bedside. 

## 2016-11-22 ENCOUNTER — Encounter (HOSPITAL_BASED_OUTPATIENT_CLINIC_OR_DEPARTMENT_OTHER): Payer: Self-pay

## 2016-11-22 ENCOUNTER — Emergency Department (HOSPITAL_BASED_OUTPATIENT_CLINIC_OR_DEPARTMENT_OTHER)
Admission: EM | Admit: 2016-11-22 | Discharge: 2016-11-23 | Disposition: A | Discharge status: Home / Self Care | Payer: Medicaid Other | Attending: Emergency Medicine | Admitting: Emergency Medicine

## 2016-11-22 DIAGNOSIS — M79674 Pain in right toe(s): Secondary | ICD-10-CM | POA: Diagnosis present

## 2016-11-22 DIAGNOSIS — Z87891 Personal history of nicotine dependence: Secondary | ICD-10-CM | POA: Insufficient documentation

## 2016-11-22 DIAGNOSIS — L600 Ingrowing nail: Secondary | ICD-10-CM | POA: Diagnosis not present

## 2016-11-22 MED ORDER — BUPIVACAINE HCL 0.5 % IJ SOLN
50.0000 mL | Freq: Once | INTRAMUSCULAR | Status: AC
  Administered 2016-11-22: 50 mL

## 2016-11-22 NOTE — ED Provider Notes (Addendum)
[facility] Provider Note: Stephanie Dell, MD, FACEP  CSN: [id number] MRN: [id number] ARRIVAL: 11/22/16 at 2100 ROOM: [facility]   CHIEF COMPLAINT  Nail Problem   HISTORY OF PRESENT ILLNESS  11/22/16 11:05 PM Stephanie Clayton is a 31 y.o. female who complains of an ingrown toenail of the right great toe for the past month. There is pain, tenderness, drainage and swelling of the lateral aspect of the right great toenail. Pain is worse with ambulation. She rates her pain as a 9 out of 10. She is also having some lesser pain associated with the left great toenail that began today.  Consultation with the [facility] state controlled substances database reveals the patient has received one opioid prescriptions in the past year.   History reviewed. No pertinent past medical history.  Past Surgical History:  Procedure Laterality Date  . CESAREAN SECTION    . CHOLECYSTECTOMY    . TUBAL LIGATION      No family history on file.  Social History  Substance Use Topics  . Smoking status: Former Smoker    Packs/day: 0.50    Types: Cigarettes  . Smokeless tobacco: Never Used  . Alcohol use No    Prior to Admission medications   Not on File    Allergies Patient has no known allergies.   REVIEW OF SYSTEMS  Negative except as noted here or in the History of Present Illness.   PHYSICAL EXAMINATION  Initial Vital Signs Blood pressure 131/88, pulse 70, temperature 98.1 F (36.7 C), temperature source Oral, resp. rate 18, height  (1.626 m), weight 97.1 kg (214 lb), last menstrual period 11/05/2016, SpO2 100 %, unknown if currently breastfeeding.  Examination General: Well-developed, well-nourished female in no acute distress; appearance consistent with age of record HENT: normocephalic; atraumatic Eyes: Normal appearance Neck: supple Heart: regular rate and rhythm Lungs: clear to auscultation bilaterally Abdomen: soft; nondistended Extremities: No  deformity; full range of motion; swelling, tenderness and crusting of the lateral aspect of the right great toenail:   Neurologic: Awake, alert and oriented; motor function intact in all extremities and symmetric; no facial droop Skin: Warm and dry Psychiatric: Normal mood and affect   RESULTS  Summary of this visit's results, reviewed by myself:   EKG Interpretation  Date/Time:    Ventricular Rate:    PR Interval:    QRS Duration:   QT Interval:    QTC Calculation:   R Axis:     Text Interpretation:        Laboratory Studies: No results found for this or any previous visit (from the past 24 hour(s)). Imaging Studies: No results found.  ED COURSE  Nursing notes and initial vitals signs, including pulse oximetry, reviewed.  Vitals:   11/22/16 2109  BP: 131/88  Pulse: 70  Resp: 18  Temp: 98.1 F (36.7 C)  TempSrc: Oral  SpO2: 100%  Weight: 97.1 kg (214 lb)  Height:  (1.626 m)   Will refer to podiatry for follow-up. She was advised that I do not believe that surgical intervention of the left great toenail is indicated at this time.  PROCEDURES   WEDGE RESECTION The patient's right great toe was prepped and draped in the usual sterile fashion. A lateral hemi-digital block was performed by injecting 3 milliliters of 0.5% bupivacaine without epinephrine into the base of the toe. After adequate anesthesia was obtained a wedge of ingrown toenail was excised with scissors. The patient tolerated this well and there  were no immediate complications.  ED DIAGNOSES     ICD-10-CM   1. Ingrown right big toenail L60.0        Kion Huntsberry, MD 11/23/16 0002    Paula Libra, MD 11/23/16 0004    Paula Libra, MD 11/23/16 Jorje Guild

## 2016-11-22 NOTE — ED Triage Notes (Signed)
C/o right and left ingrown great toenails x 3 days-NAD-steady gait

## 2016-11-23 MED ORDER — HYDROCODONE-ACETAMINOPHEN 5-325 MG PO TABS: 1.0000 | ORAL_TABLET | Freq: Four times a day (QID) | ORAL | 0 refills | Status: DC | PRN

## 2016-11-23 NOTE — ED Notes (Signed)
Toe dressed with xeroform, gauze and secured with coban. Pt given instructions to change bandage daily or if soiled and wash with soap and water between bandage changes. Pt verbalized instructions.

## 2016-12-27 ENCOUNTER — Ambulatory Visit: Payer: Medicaid Other | Admitting: Podiatry

## 2017-01-03 ENCOUNTER — Ambulatory Visit: Payer: Medicaid Other | Admitting: Podiatry

## 2018-12-04 ENCOUNTER — Other Ambulatory Visit: Payer: Self-pay

## 2018-12-04 ENCOUNTER — Emergency Department (HOSPITAL_BASED_OUTPATIENT_CLINIC_OR_DEPARTMENT_OTHER)
Admission: EM | Admit: 2018-12-04 | Discharge: 2018-12-04 | Disposition: A | Discharge status: Home / Self Care | Payer: Medicaid Other | Attending: Emergency Medicine | Admitting: Emergency Medicine

## 2018-12-04 ENCOUNTER — Encounter (HOSPITAL_BASED_OUTPATIENT_CLINIC_OR_DEPARTMENT_OTHER): Payer: Self-pay

## 2018-12-04 DIAGNOSIS — Z79899 Other long term (current) drug therapy: Secondary | ICD-10-CM | POA: Diagnosis not present

## 2018-12-04 DIAGNOSIS — M5410 Radiculopathy, site unspecified: Secondary | ICD-10-CM | POA: Diagnosis not present

## 2018-12-04 DIAGNOSIS — M25551 Pain in right hip: Secondary | ICD-10-CM | POA: Diagnosis present

## 2018-12-04 DIAGNOSIS — Z87891 Personal history of nicotine dependence: Secondary | ICD-10-CM | POA: Diagnosis not present

## 2018-12-04 MED ORDER — KETOROLAC TROMETHAMINE 30 MG/ML IJ SOLN
30.0000 mg | Freq: Once | INTRAMUSCULAR | Status: AC
  Administered 2018-12-04: 30 mg via INTRAMUSCULAR
  Filled 2018-12-04: qty 1

## 2018-12-04 MED ORDER — LIDOCAINE 5 % EX PTCH: 1.0000 | MEDICATED_PATCH | CUTANEOUS | 0 refills | Status: DC

## 2018-12-04 MED ORDER — PREDNISONE 10 MG (21) PO TBPK: ORAL_TABLET | Freq: Every day | ORAL | 0 refills | Status: DC

## 2018-12-04 MED ORDER — ACETAMINOPHEN 500 MG PO TABS: 500.0000 mg | ORAL_TABLET | Freq: Four times a day (QID) | ORAL | 0 refills | Status: DC | PRN

## 2018-12-04 NOTE — ED Provider Notes (Signed)
[facility] EMERGENCY DEPARTMENT Provider Note   CSN: [id number] Arrival date & time: 12/04/18  2058     History   Chief Complaint Chief Complaint  Patient presents with  . Hip Pain    HPI Stephanie Clayton is a 33 y.o. female presents for evaluation of acute onset, intermittent right lower extremity pain for 5 months.  Denies any known trauma, bending lifting or twisting injuries that precipitated the pain.  She reports pain is sharp, radiates from the right buttock down the posterior aspect of the right lower extremity.  Worsens with sitting or applying pressure on the right side, improves laying on her left side.  Denies numbness or weakness, saddle anesthesia, bowel or bladder incontinence, fever, history of cancer or IV drug use.  Has been taking Flexeril and Excedrin with some temporary relief.  Has also been doing some exercises at home but reports that she has some difficulty doing the exercises when the pain flares up.     The history is provided by the patient.    History reviewed. No pertinent past medical history.  Patient Active Problem List   Diagnosis Date Noted  . TOBACCO ABUSE 05/14/2007  . ABSENCE OF MENSTRUATION 05/14/2007  . PAIN IN SOFT TISSUES OF LIMB 03/16/2007    Past Surgical History:  Procedure Laterality Date  . CESAREAN SECTION    . CHOLECYSTECTOMY    . TUBAL LIGATION       OB History    Gravida  1   Para      Term      Preterm      AB      Living        SAB      TAB      Ectopic      Multiple      Live Births               Home Medications    Prior to Admission medications   Medication Sig Start Date End Date Taking? Authorizing Provider  acetaminophen (TYLENOL) 500 MG tablet Take 1 tablet (500 mg total) by mouth every 6 (six) hours as needed. 12/04/18   Haniah Penny A, PA-C  HYDROcodone-acetaminophen (NORCO) 5-325 MG tablet Take 1 tablet by mouth every 6 (six) hours as needed (for pain). 11/23/16    Molpus, John, MD  lidocaine (LIDODERM) 5 % Place 1 patch onto the skin daily. Remove & Discard patch within 12 hours or as directed by MD 12/04/18   Rodell Perna A, PA-C  predniSONE (STERAPRED UNI-PAK 21 TAB) 10 MG (21) TBPK tablet Take by mouth daily. Take 6 tabs by mouth daily  for 2 days, then 5 tabs for 2 days, then 4 tabs for 2 days, then 3 tabs for 2 days, 2 tabs for 2 days, then 1 tab by mouth daily for 2 days 12/04/18   Renita Papa, PA-C    Family History No family history on file.  Social History Social History   Tobacco Use  . Smoking status: Former Smoker    Packs/day: 0.50    Types: Cigarettes  . Smokeless tobacco: Never Used  Substance Use Topics  . Alcohol use: No  . Drug use: No     Allergies   Patient has no known allergies.   Review of Systems Review of Systems  Constitutional: Negative for chills and fever.  Cardiovascular: Negative for leg swelling.  Gastrointestinal: Negative for abdominal pain.  Genitourinary: Negative for dysuria, frequency  and hematuria.  Musculoskeletal: Positive for myalgias. Negative for back pain.  Neurological: Negative for weakness and numbness.     Physical Exam Updated Vital Signs BP 127/73 (BP Location: Right Arm)   Pulse 68   Temp 98.3 F (36.8 C) (Oral)   Resp 16   Ht 5\' 4"  (1.626 m)   Wt 87.5 kg   LMP 11/06/2018   SpO2 99%   BMI 33.13 kg/m   Physical Exam Vitals signs and nursing note reviewed.  Constitutional:      General: She is not in acute distress.    Appearance: She is well-developed.  HENT:     Head: Normocephalic and atraumatic.  Eyes:     General:        Right eye: No discharge.        Left eye: No discharge.     Conjunctiva/sclera: Conjunctivae normal.  Neck:     Musculoskeletal: Neck supple.     Vascular: No JVD.     Trachea: No tracheal deviation.  Cardiovascular:     Rate and Rhythm: Normal rate and regular rhythm.     Pulses: Normal pulses.     Comments: 2+DP/PT pulses  bilaterally, Homans sign absent bilaterally, no lower extremity edema, no palpable cords, compartments are soft  Pulmonary:     Effort: Pulmonary effort is normal.     Breath sounds: Normal breath sounds.  Abdominal:     General: There is no distension.  Musculoskeletal:     Comments: No midline lumbar spine tenderness, no paralumbar muscle tenderness.  No SI joint tenderness.  Point tenderness to the right piriformis.  5/5 strength of BLE major muscle groups with pain elicited with flexion of the right hip and with ambulation.  Normal range of motion of the lumbar spine with pain elicited with flexion.  Skin:    General: Skin is warm and dry.     Findings: No erythema.  Neurological:     General: No focal deficit present.     Mental Status: She is alert.     Comments: Fluent speech, no facial droop, sensation intact to light touch of bilateral lower extremities.  Patient ambulates with an antalgic gait but is able to heel walk and toe walk without difficulty.  Psychiatric:        Behavior: Behavior normal.      ED Treatments / Results  Labs (all labs ordered are listed, but only abnormal results are displayed) Labs Reviewed - No data to display  EKG None  Radiology No results found.  Procedures Procedures (including critical care time)  Medications Ordered in ED Medications  ketorolac (TORADOL) 30 MG/ML injection 30 mg (30 mg Intramuscular Given 12/04/18 2242)     Initial Impression / Assessment and Plan / ED Course  I have reviewed the triage vital signs and the nursing notes.  Pertinent labs & imaging results that were available during my care of the patient were reviewed by me and considered in my medical decision making (see chart for details).        Patient presenting for evaluation of radicular right lower extremity pain for 5 months.  No known trauma. She is afebrile, vital signs are stable.  She is nontoxic in appearance.  She is neurovascularly intact.   No red flag signs concerning for cauda equina or spinal abscess; no midline spine tenderness to palpation.  Abdomen soft and nontender and she has no urinary symptoms.  Doubt acute surgical abdominal pathology including dissection.  She  is ambulatory despite pain.  Discussed conservative therapy and management with steroid taper, Tylenol, gentle stretching, heat.  She has been taking Flexeril at home and we discussed side effects and she does not taking when she knows she is going to drive.  Recommend follow-up with PCP or orthopedist for reevaluation of symptoms.  Discussed strict ED return precautions. Patient verbalized understanding of and agreement with plan and is safe for discharge home at this time.   Final Clinical Impressions(s) / ED Diagnoses   Final diagnoses:  Radicular pain of right lower extremity    ED Discharge Orders         Ordered    predniSONE (STERAPRED UNI-PAK 21 TAB) 10 MG (21) TBPK tablet  Daily     12/04/18 2321    acetaminophen (TYLENOL) 500 MG tablet  Every 6 hours PRN     12/04/18 2321    lidocaine (LIDODERM) 5 %  Every 24 hours     12/04/18 2321           Jeanie Sewer, PA-C 12/04/18 2336    Melene Plan, DO 12/05/18 1504

## 2018-12-04 NOTE — ED Triage Notes (Signed)
Pt c/o right hip/buttock pain radiates down right leg-started in May-denies injury-NAD-steady gait

## 2018-12-04 NOTE — Discharge Instructions (Signed)
1. Medications: Take steroid taper as prescribed with food to avoid upset stomach issues.  Do not take ibuprofen, Advil, Aleve, or Motrin while taking this medicine.  You may take [id number] mg of Tylenol every 6 hours as needed for pain. Do not exceed 4000 mg of Tylenol daily.  You can take Flexeril as needed for muscle relaxation but this medication may make you drowsy so do not drive, drink alcohol, operate heavy machinery, or make important decisions while you are using this medicine.  I typically recommend only taking this medicine at night.  You can also cut these tablets in half if they are very strong. 2. Treatment: rest,drink plenty of fluids, apply heat 20 minutes at a time 3-4 times daily gentle stretching (see attached exercises, but you can also YouTube search sciatica physical therapy exercises).   3. Follow Up: Please followup with orthopedics as directed or your PCP in 1 week if no improvement for discussion of your diagnoses and further evaluation after today's visit; if you do not have a primary care doctor use the resource guide provided to find one; Please return to the ER for worsening symptoms or other concerns such as worsening swelling, redness of the skin, fevers, loss of pulses, or loss of feeling

## 2019-03-02 ENCOUNTER — Other Ambulatory Visit: Payer: Self-pay

## 2019-03-02 ENCOUNTER — Emergency Department (HOSPITAL_BASED_OUTPATIENT_CLINIC_OR_DEPARTMENT_OTHER)
Admission: EM | Admit: 2019-03-02 | Discharge: 2019-03-02 | Disposition: A | Discharge status: Home / Self Care | Payer: Medicaid Other | Attending: Emergency Medicine | Admitting: Emergency Medicine

## 2019-03-02 ENCOUNTER — Encounter (HOSPITAL_BASED_OUTPATIENT_CLINIC_OR_DEPARTMENT_OTHER): Payer: Self-pay | Admitting: *Deleted

## 2019-03-02 DIAGNOSIS — Z87891 Personal history of nicotine dependence: Secondary | ICD-10-CM | POA: Diagnosis not present

## 2019-03-02 DIAGNOSIS — M5441 Lumbago with sciatica, right side: Secondary | ICD-10-CM | POA: Insufficient documentation

## 2019-03-02 DIAGNOSIS — M545 Low back pain: Secondary | ICD-10-CM | POA: Diagnosis present

## 2019-03-02 MED ORDER — KETOROLAC TROMETHAMINE 15 MG/ML IJ SOLN
15.0000 mg | Freq: Once | INTRAMUSCULAR | Status: AC
  Administered 2019-03-02: 15 mg via INTRAMUSCULAR
  Filled 2019-03-02: qty 1

## 2019-03-02 MED ORDER — LIDOCAINE 5 % EX PTCH: 1.0000 | MEDICATED_PATCH | CUTANEOUS | 0 refills | Status: DC

## 2019-03-02 MED ORDER — METHOCARBAMOL 500 MG PO TABS: 500.0000 mg | ORAL_TABLET | Freq: Every evening | ORAL | 0 refills | Status: DC | PRN

## 2019-03-02 MED ORDER — PREDNISONE 10 MG (21) PO TBPK: ORAL_TABLET | Freq: Every day | ORAL | 0 refills | Status: DC

## 2019-03-02 NOTE — ED Provider Notes (Signed)
[facility] EMERGENCY DEPARTMENT Provider Note   CSN: [id number] Arrival date & time: 03/02/19  1911     History Chief Complaint  Patient presents with  . Sciatica    Stephanie Clayton is a 34 y.o. female presenting for evaluation of back pain.  Patient states the past several days, she has had gradually worsening low back pain.  Pain is mostly in the right side of her back, radiates down her right leg.  She reports a history of sciatica, states this feels the same.  She denies precipitating event such as fall, trauma, or injury.  She has fevers, chills, nausea, vomiting, urinary symptoms, loss of bowel bladder control, numbness, tingling, history of cancer, history of IVDU.  She reports no other medical problems, takes her medications daily.  She has not taken anything for her symptoms.  Patient states her symptoms normally improved with prednisone and muscle x-rays.  She has not followed up with her primary care doctor or an orthopedic doctor regarding this.  HPI     History reviewed. No pertinent past medical history.  Patient Active Problem List   Diagnosis Date Noted  . TOBACCO ABUSE 05/14/2007  . ABSENCE OF MENSTRUATION 05/14/2007  . PAIN IN SOFT TISSUES OF LIMB 03/16/2007    Past Surgical History:  Procedure Laterality Date  . CESAREAN SECTION    . CHOLECYSTECTOMY    . TUBAL LIGATION       OB History    Gravida  1   Para      Term      Preterm      AB      Living        SAB      TAB      Ectopic      Multiple      Live Births              No family history on file.  Social History   Tobacco Use  . Smoking status: Former Smoker    Packs/day: 0.50    Types: Cigarettes  . Smokeless tobacco: Never Used  Substance Use Topics  . Alcohol use: No  . Drug use: No    Home Medications Prior to Admission medications   Medication Sig Start Date End Date Taking? Authorizing Provider  acetaminophen (TYLENOL) 500 MG tablet Take 1  tablet (500 mg total) by mouth every 6 (six) hours as needed. 12/04/18   Fawze, Mina A, PA-C  HYDROcodone-acetaminophen (NORCO) 5-325 MG tablet Take 1 tablet by mouth every 6 (six) hours as needed (for pain). 11/23/16   Molpus, John, MD  lidocaine (LIDODERM) 5 % Place 1 patch onto the skin daily. Remove & Discard patch within 12 hours or as directed by MD 03/02/19   Loretta Kluender, PA-C  methocarbamol (ROBAXIN) 500 MG tablet Take 1 tablet (500 mg total) by mouth at bedtime as needed. 03/02/19   Maela Takeda, PA-C  predniSONE (STERAPRED UNI-PAK 21 TAB) 10 MG (21) TBPK tablet Take by mouth daily. Take 6 tabs by mouth daily  for 2 days, then 5 tabs for 2 days, then 4 tabs for 2 days, then 3 tabs for 2 days, 2 tabs for 2 days, then 1 tab by mouth daily for 2 days 03/02/19   Elanda Garmany, PA-C    Allergies    Patient has no known allergies.  Review of Systems   Review of Systems  Musculoskeletal: Positive for back pain.  All other systems reviewed and  are negative.   Physical Exam Updated Vital Signs BP 120/86 (BP Location: Right Arm)   Pulse 80   Temp 98.4 F (36.9 C) (Oral)   Resp 18   Ht 5\' 4"  (1.626 m)   Wt 86 kg   LMP 02/28/2019   SpO2 100%   Breastfeeding No   BMI 32.53 kg/m   Physical Exam Vitals and nursing note reviewed.  Constitutional:      General: She is not in acute distress.    Appearance: She is well-developed.     Comments: Resting comfortably in the chair no acute distress  HENT:     Head: Normocephalic and atraumatic.  Eyes:     Conjunctiva/sclera: Conjunctivae normal.     Pupils: Pupils are equal, round, and reactive to light.  Cardiovascular:     Rate and Rhythm: Normal rate and regular rhythm.     Pulses: Normal pulses.  Pulmonary:     Effort: Pulmonary effort is normal. No respiratory distress.     Breath sounds: Normal breath sounds. No wheezing.  Abdominal:     General: There is no distension.     Palpations: Abdomen is soft. There is no  mass.     Tenderness: There is no abdominal tenderness. There is no guarding or rebound.  Musculoskeletal:        General: Normal range of motion.     Cervical back: Normal range of motion and neck supple.     Comments: Tenderness palpation of right low back musculature.  Mild tenderness palpation of the midline spine, but no step-offs or deformities.  Tenderness over the right buttock.  Positive straight leg raise.  Pedal pulses intact bilaterally.  Patellar reflexes equal bilaterally.  Patient is ambulatory.  Skin:    General: Skin is warm and dry.     Capillary Refill: Capillary refill takes less than 2 seconds.  Neurological:     Mental Status: She is alert and oriented to person, place, and time.     ED Results / Procedures / Treatments   Labs (all labs ordered are listed, but only abnormal results are displayed) Labs Reviewed - No data to display  EKG None  Radiology No results found.  Procedures Procedures (including critical care time)  Medications Ordered in ED Medications  ketorolac (TORADOL) 15 MG/ML injection 15 mg (has no administration in time range)    ED Course  I have reviewed the triage vital signs and the nursing notes.  Pertinent labs & imaging results that were available during my care of the patient were reviewed by me and considered in my medical decision making (see chart for details).    MDM Rules/Calculators/A&P                      Patient presenting for evaluation of low back pain.  Physical exam reassuring, neurovascularly intact.  No red flags for back pain.  Pain is reproducible with palpation of the musculature.  Likely musculoskeletal/sciatica.  Doubt fracture, I do not believe x-rays will be beneficial.  Doubt vertebral injury, infection, spinal cord compression, myelopathy, or cauda equina syndrome.  Will treat symptomatically with prednisone, muscle relaxers, Lidoderm, muscle creams.  Patient given information to follow-up with primary  care.  At this time, patient appears safe for discharge.  Return precautions given.  Patient states she understands and agrees to plan.  Final Clinical Impression(s) / ED Diagnoses Final diagnoses:  Acute right-sided low back pain with right-sided sciatica  Rx / DC Orders ED Discharge Orders         Ordered    lidocaine (LIDODERM) 5 %  Every 24 hours     03/02/19 2016    predniSONE (STERAPRED UNI-PAK 21 TAB) 10 MG (21) TBPK tablet  Daily     03/02/19 2016    methocarbamol (ROBAXIN) 500 MG tablet  At bedtime PRN     03/02/19 2016           Alveria Apley, PA-C 03/02/19 2024    Jacalyn Lefevre, MD 03/02/19 2130

## 2019-03-02 NOTE — ED Triage Notes (Signed)
Pt reports Hx of sciatica. C/o pain in right side of back radiating into  Right leg. Pain is worse at night

## 2019-03-02 NOTE — Discharge Instructions (Addendum)
Take prednisone as prescribed.  Do not take other anti-inflammatories at the same time open (Advil, Motrin, ibuprofen, aleve). You may supplement with Tylenol if you need further pain control. Use Robaxin as needed for muscle stiffness or soreness. Have caution, as this may make you tired or groggy. Do not driver or operate heavy machinery while taking this medication.  Use lidocaine patches for pain control. Use muscle creams (bengay, icy hot, salonpas) as needed for pain.  Do the stretches in the paperwork.  Follow up with a primary care doctor if pain is not improving with this treatment. You may call the office below or the number in the paperwork to help you set up a primary care doctor.  Return to the ER if you develop high fevers, numbness, loss of bowel or bladder control, or any new or concerning symptoms.  

## 2019-03-02 NOTE — ED Notes (Signed)
ED Provider at bedside. 

## 2019-06-04 ENCOUNTER — Other Ambulatory Visit: Payer: Self-pay

## 2019-06-04 ENCOUNTER — Emergency Department (HOSPITAL_BASED_OUTPATIENT_CLINIC_OR_DEPARTMENT_OTHER)
Admission: EM | Admit: 2019-06-04 | Discharge: 2019-06-04 | Disposition: A | Discharge status: Home / Self Care | Payer: Medicaid Other | Attending: Emergency Medicine | Admitting: Emergency Medicine

## 2019-06-04 ENCOUNTER — Encounter (HOSPITAL_BASED_OUTPATIENT_CLINIC_OR_DEPARTMENT_OTHER): Payer: Self-pay | Admitting: Emergency Medicine

## 2019-06-04 DIAGNOSIS — M5431 Sciatica, right side: Secondary | ICD-10-CM | POA: Diagnosis not present

## 2019-06-04 DIAGNOSIS — Z87891 Personal history of nicotine dependence: Secondary | ICD-10-CM | POA: Diagnosis not present

## 2019-06-04 DIAGNOSIS — M545 Low back pain: Secondary | ICD-10-CM | POA: Diagnosis present

## 2019-06-04 HISTORY — DX: Sciatica, unspecified side: M54.30

## 2019-06-04 MED ORDER — HYDROCODONE-ACETAMINOPHEN 5-325 MG PO TABS: 1.0000 | ORAL_TABLET | Freq: Four times a day (QID) | ORAL | 0 refills | Status: DC | PRN

## 2019-06-04 MED ORDER — NAPROXEN 375 MG PO TABS: 375.0000 mg | ORAL_TABLET | Freq: Two times a day (BID) | ORAL | 0 refills | Status: DC

## 2019-06-04 MED ORDER — HYDROMORPHONE HCL 1 MG/ML IJ SOLN
2.0000 mg | Freq: Once | INTRAMUSCULAR | Status: AC
  Administered 2019-06-04: 06:00:00 2 mg via INTRAMUSCULAR
  Filled 2019-06-04: qty 2

## 2019-06-04 MED ORDER — NAPROXEN 250 MG PO TABS
500.0000 mg | ORAL_TABLET | Freq: Once | ORAL | Status: AC
  Administered 2019-06-04: 06:00:00 500 mg via ORAL
  Filled 2019-06-04: qty 2

## 2019-06-04 NOTE — ED Provider Notes (Signed)
[facility] Provider Note: [name], MD, FACEP  CSN: [id number] MRN: [id number] ARRIVAL: 06/04/19 at 0501 ROOM: MH12/MH12   CHIEF COMPLAINT  Sciatica   HISTORY OF PRESENT ILLNESS  06/04/19 5:40 AM Stephanie Clayton is a 34 y.o. female with a history of sciatica.  She is here with an acute exacerbation of her sciatica that began this morning about 1:30 AM.  The pain is in the right paralumbar region radiating to her right buttock and down her right leg.  There is intermittent numbness associated with it but not at the present moment.  She rates her pain is a 10 out of 10, worse with movement or sitting.  She prefers to lie in a prone position.  She denies any change in bowel or bladder functions.  She is not aware of any new weakness but movement is limited due to pain.  She has taken Tylenol without relief of the pain.   Past Medical History:  Diagnosis Date  . Sciatic leg pain     Past Surgical History:  Procedure Laterality Date  . CESAREAN SECTION    . CHOLECYSTECTOMY    . TUBAL LIGATION      No family history on file.  Social History   Tobacco Use  . Smoking status: Former Smoker    Packs/day: 0.50    Types: Cigarettes  . Smokeless tobacco: Never Used  Substance Use Topics  . Alcohol use: No  . Drug use: No    Prior to Admission medications   Medication Sig Start Date End Date Taking? Authorizing Provider  HYDROcodone-acetaminophen (NORCO) 5-325 MG tablet Take 1 tablet by mouth every 6 (six) hours as needed for severe pain (for pain). 06/04/19   Jonathen Rathman, MD  lidocaine (LIDODERM) 5 % Place 1 patch onto the skin daily. Remove & Discard patch within 12 hours or as directed by MD 03/02/19   Caccavale, Sophia, PA-C  naproxen (NAPROSYN) 375 MG tablet Take 1 tablet (375 mg total) by mouth 2 (two) times daily. 06/04/19   Ghina Bittinger, Jonny Ruiz, MD    Allergies Patient has no known allergies.   REVIEW OF SYSTEMS  Negative except as noted here or in the  History of Present Illness.   PHYSICAL EXAMINATION  Initial Vital Signs Blood pressure 130/88, pulse 73, temperature 97.8 F (36.6 C), temperature source Oral, resp. rate 16, height 5\' 4"  (1.626 m), SpO2 100 %.  Examination General: Well-developed, well-nourished female in no acute distress; appearance consistent with age of record HENT: normocephalic; atraumatic Eyes: Normal appearance Neck: supple Heart: regular rate and rhythm Lungs: clear to auscultation bilaterally Abdomen: soft; nondistended; nontender Extremities: No deformity; decreased range of motion right hip; pulses normal Back: Right paralumbar tenderness with right sciatic notch tenderness, pain on attempted movement of right hip Neurologic: Awake, alert and oriented; motor function intact in all extremities and symmetric; no facial droop Skin: Warm and dry Psychiatric: Normal mood and affect   RESULTS  Summary of this visit's results, reviewed and interpreted by myself:   EKG Interpretation  Date/Time:    Ventricular Rate:    PR Interval:    QRS Duration:   QT Interval:    QTC Calculation:   R Axis:     Text Interpretation:        Laboratory Studies: No results found for this or any previous visit (from the past 24 hour(s)). Imaging Studies: No results found.  ED COURSE and MDM  Nursing notes, initial and subsequent vitals signs,  including pulse oximetry, reviewed and interpreted by myself.  Vitals:   06/04/19 0513 06/04/19 0514  BP:  130/88  Pulse:  73  Resp:  16  Temp:  97.8 F (36.6 C)  TempSrc:  Oral  SpO2:  100%  Height: 5\' 4"  (1.626 m)    Medications  naproxen (NAPROSYN) tablet 500 mg (has no administration in time range)  HYDROmorphone (DILAUDID) injection 2 mg (has no administration in time range)    Examination consistent with exacerbation of sciatica.  PROCEDURES  Procedures   ED DIAGNOSES     ICD-10-CM   1. Sciatica of right side  M54.31        Jurney Overacker,  MD 06/04/19 [phone]

## 2019-06-04 NOTE — ED Triage Notes (Signed)
Pt c/o right sided sciatic pain with history of same.

## 2019-06-12 ENCOUNTER — Other Ambulatory Visit: Payer: Self-pay

## 2019-06-12 ENCOUNTER — Emergency Department (HOSPITAL_BASED_OUTPATIENT_CLINIC_OR_DEPARTMENT_OTHER): Payer: Medicaid Other

## 2019-06-12 ENCOUNTER — Encounter (HOSPITAL_BASED_OUTPATIENT_CLINIC_OR_DEPARTMENT_OTHER): Payer: Self-pay | Admitting: *Deleted

## 2019-06-12 DIAGNOSIS — M25461 Effusion, right knee: Secondary | ICD-10-CM | POA: Insufficient documentation

## 2019-06-12 DIAGNOSIS — M25561 Pain in right knee: Secondary | ICD-10-CM | POA: Diagnosis present

## 2019-06-12 DIAGNOSIS — Z87891 Personal history of nicotine dependence: Secondary | ICD-10-CM | POA: Insufficient documentation

## 2019-06-12 NOTE — ED Triage Notes (Signed)
Pt c/oright knee pain with obvious swelling x 1 day , denies injury

## 2019-06-13 ENCOUNTER — Emergency Department (HOSPITAL_BASED_OUTPATIENT_CLINIC_OR_DEPARTMENT_OTHER)
Admission: EM | Admit: 2019-06-13 | Discharge: 2019-06-13 | Disposition: A | Discharge status: Home / Self Care | Payer: Medicaid Other | Attending: Emergency Medicine | Admitting: Emergency Medicine

## 2019-06-13 DIAGNOSIS — M25461 Effusion, right knee: Secondary | ICD-10-CM

## 2019-06-13 LAB — GRAM STAIN: Special Requests: ADEQUATE

## 2019-06-13 LAB — SYNOVIAL CELL COUNT + DIFF, W/ CRYSTALS
Crystals, Fluid: NONE SEEN
Lymphocytes-Synovial Fld: 1 % (ref 0–20)
Monocyte-Macrophage-Synovial Fluid: 94 % — ABNORMAL HIGH (ref 50–90)
Neutrophil, Synovial: 5 % (ref 0–25)
WBC, Synovial: 800 /mm3 — ABNORMAL HIGH (ref 0–200)

## 2019-06-13 MED ORDER — LIDOCAINE-EPINEPHRINE 1 %-1:100000 IJ SOLN
INTRAMUSCULAR | Status: AC
  Administered 2019-06-13: 1 mL
  Filled 2019-06-13: qty 1

## 2019-06-13 MED ORDER — NAPROXEN 250 MG PO TABS
500.0000 mg | ORAL_TABLET | Freq: Once | ORAL | Status: AC
  Administered 2019-06-13: 500 mg via ORAL
  Filled 2019-06-13: qty 2

## 2019-06-13 MED ORDER — NAPROXEN 375 MG PO TABS: 375.0000 mg | ORAL_TABLET | Freq: Two times a day (BID) | ORAL | 0 refills | Status: DC

## 2019-06-13 MED ORDER — HYDROCODONE-ACETAMINOPHEN 5-325 MG PO TABS
1.0000 | ORAL_TABLET | Freq: Once | ORAL | Status: AC
  Administered 2019-06-13: 1 via ORAL
  Filled 2019-06-13: qty 1

## 2019-06-13 MED ORDER — HYDROCODONE-ACETAMINOPHEN 5-325 MG PO TABS: 1.0000 | ORAL_TABLET | ORAL | 0 refills | Status: AC | PRN

## 2019-06-13 NOTE — ED Provider Notes (Signed)
[facility] Provider Note: Georgena Spurling, MD, FACEP  CSN: [id number] MRN: [id number] ARRIVAL: 06/12/19 at 2330 ROOM: [facility]  Knee Pain   HISTORY OF PRESENT ILLNESS  06/13/19 2:26 AM Stephanie Clayton is a 34 y.o. female with a 1 day history of pain and swelling in her right knee.  She rates the pain is a 10 out of 10, worse with movement or weightbearing.  She denies any recent injury.    Past Medical History:  Diagnosis Date  . Sciatic leg pain     Past Surgical History:  Procedure Laterality Date  . CESAREAN SECTION    . CHOLECYSTECTOMY    . TUBAL LIGATION      History reviewed. No pertinent family history.  Social History   Tobacco Use  . Smoking status: Former Smoker    Packs/day: 0.50    Types: Cigarettes  . Smokeless tobacco: Never Used  Substance Use Topics  . Alcohol use: No  . Drug use: No    Prior to Admission medications   Medication Sig Start Date End Date Taking? Authorizing Provider  HYDROcodone-acetaminophen (NORCO) 5-325 MG tablet Take 1 tablet by mouth every 4 (four) hours as needed for severe pain (for pain). 06/13/19   Miguel Medal, Jenny Reichmann, MD  naproxen (NAPROSYN) 375 MG tablet Take 1 tablet (375 mg total) by mouth 2 (two) times daily with a meal. 06/13/19   Precious Segall, MD    Allergies Patient has no known allergies.   REVIEW OF SYSTEMS  Negative except as noted here or in the History of Present Illness.   PHYSICAL EXAMINATION  Initial Vital Signs Blood pressure 140/84, pulse 80, temperature 98.1 F (36.7 C), temperature source Oral, resp. rate 18, height 5\' 4"  (1.626 m), weight 88.9 kg, last menstrual period 05/22/2019, SpO2 100 %.  Examination General: Well-developed, well-nourished female in no acute distress; appearance consistent with age of record HENT: normocephalic; atraumatic Eyes: Normal appearance Neck: supple Heart: regular rate and rhythm Lungs: clear to auscultation bilaterally Abdomen:  soft; nondistended; nontender; bowel sounds present Extremities: No deformity; pulses normal; tenderness and effusion of right knee with decreased range of motion, no erythema or warmth Neurologic: Awake, alert and oriented; motor function intact in all extremities and symmetric; no facial droop Skin: Warm and dry Psychiatric: Normal mood and affect   RESULTS  Summary of this visit's results, reviewed and interpreted by myself:   EKG Interpretation  Date/Time:    Ventricular Rate:    PR Interval:    QRS Duration:   QT Interval:    QTC Calculation:   R Axis:     Text Interpretation:        Laboratory Studies: No results found for this or any previous visit (from the past 24 hour(s)). Imaging Studies: DG Knee Complete 4 Views Right  Result Date: 06/12/2019 CLINICAL DATA:  Swelling EXAM: RIGHT KNEE - COMPLETE 4+ VIEW COMPARISON:  None. FINDINGS: No fracture or malalignment. Joint spaces are maintained. Moderate knee effusion. IMPRESSION: Knee effusion.  No acute osseous abnormality Electronically Signed   By: Donavan Foil M.D.   On: 06/12/2019 23:57    ED COURSE and MDM  Nursing notes, initial and subsequent vitals signs, including pulse oximetry, reviewed and interpreted by myself.  Vitals:   06/12/19 2336 06/12/19 2338  BP:  140/84  Pulse:  80  Resp:  18  Temp:  98.1 F (36.7 C)  TempSrc:  Oral  SpO2:  100%  Weight: 88.9  kg   Height: 5\' 4"  (1.626 m)    Medications  lidocaine-EPINEPHrine (XYLOCAINE W/EPI) 1 %-1:100000 (with pres) injection (has no administration in time range)  naproxen (NAPROSYN) tablet 500 mg (has no administration in time range)  HYDROcodone-acetaminophen (NORCO/VICODIN) 5-325 MG per tablet 1 tablet (has no administration in time range)    Differential diagnosis includes septic knee joint of which I have a low suspicion after evaluating the synovial fluid.  It could also represent gout, pseudogout or acute arthritis.  We will treat with naproxen  and crutches pending synovial fluid analysis.  PROCEDURES  .Joint Aspiration/Arthrocentesis  Date/Time: 06/13/2019 2:43 AM Performed by: Marga Gramajo, MD Authorized by: Hunter Pinkard, MD   Consent:    Consent obtained:  Verbal   Consent given by:  Patient   Risks discussed:  Pain   Alternatives discussed:  No treatment Location:    Location:  Knee   Knee:  R knee Anesthesia (see MAR for exact dosages):    Anesthesia method:  Local infiltration   Local anesthetic:  Lidocaine 1% WITH epi (60mL) Procedure details:    Preparation: Patient was prepped and draped in usual sterile fashion     Needle gauge:  18 G   Ultrasound guidance: no     Approach:  Medial   Aspirate characteristics:  Yellow and blood-tinged (5mL)   Steroid injected: no     Specimen collected: yes   Post-procedure details:    Dressing:  Adhesive bandage   Patient tolerance of procedure:  Tolerated well, no immediate complications   ED DIAGNOSES     ICD-10-CM   1. Effusion of right knee  M25.461        Lizzett Nobile, 58m, MD 06/13/19 [phone]

## 2019-06-13 NOTE — ED Notes (Signed)
Called to take to treatment room  No response from lobby 

## 2019-06-18 LAB — CULTURE, BODY FLUID W GRAM STAIN -BOTTLE: Culture: NO GROWTH

## 2019-09-26 ENCOUNTER — Other Ambulatory Visit: Payer: Self-pay

## 2019-09-26 ENCOUNTER — Encounter (HOSPITAL_BASED_OUTPATIENT_CLINIC_OR_DEPARTMENT_OTHER): Payer: Self-pay | Admitting: *Deleted

## 2019-09-26 DIAGNOSIS — Z5321 Procedure and treatment not carried out due to patient leaving prior to being seen by health care provider: Secondary | ICD-10-CM | POA: Insufficient documentation

## 2019-09-26 DIAGNOSIS — M79604 Pain in right leg: Secondary | ICD-10-CM | POA: Insufficient documentation

## 2019-09-26 DIAGNOSIS — M545 Low back pain: Secondary | ICD-10-CM | POA: Diagnosis present

## 2019-09-26 NOTE — ED Triage Notes (Signed)
Lower back pain with radiation into her right leg. Hx of sciatica.

## 2019-09-27 ENCOUNTER — Emergency Department (HOSPITAL_BASED_OUTPATIENT_CLINIC_OR_DEPARTMENT_OTHER)
Admission: EM | Admit: 2019-09-27 | Discharge: 2019-09-27 | Disposition: A | Discharge status: Home / Self Care | Payer: Medicaid Other | Attending: Emergency Medicine | Admitting: Emergency Medicine

## 2019-09-27 DIAGNOSIS — Z5321 Procedure and treatment not carried out due to patient leaving prior to being seen by health care provider: Secondary | ICD-10-CM

## 2019-09-27 NOTE — ED Notes (Signed)
Called  No response from lobby 

## 2019-10-07 ENCOUNTER — Encounter (HOSPITAL_BASED_OUTPATIENT_CLINIC_OR_DEPARTMENT_OTHER): Payer: Self-pay | Admitting: Emergency Medicine

## 2019-10-07 ENCOUNTER — Other Ambulatory Visit: Payer: Self-pay

## 2019-10-07 ENCOUNTER — Emergency Department (HOSPITAL_BASED_OUTPATIENT_CLINIC_OR_DEPARTMENT_OTHER)
Admission: EM | Admit: 2019-10-07 | Discharge: 2019-10-07 | Disposition: A | Discharge status: Home / Self Care | Payer: Medicaid Other | Attending: Emergency Medicine | Admitting: Emergency Medicine

## 2019-10-07 DIAGNOSIS — Z20822 Contact with and (suspected) exposure to covid-19: Secondary | ICD-10-CM | POA: Diagnosis not present

## 2019-10-07 DIAGNOSIS — Z87891 Personal history of nicotine dependence: Secondary | ICD-10-CM | POA: Insufficient documentation

## 2019-10-07 DIAGNOSIS — R52 Pain, unspecified: Secondary | ICD-10-CM | POA: Diagnosis present

## 2019-10-07 LAB — URINALYSIS, ROUTINE W REFLEX MICROSCOPIC
Bilirubin Urine: NEGATIVE
Glucose, UA: NEGATIVE mg/dL
Ketones, ur: NEGATIVE mg/dL
Leukocytes,Ua: NEGATIVE
Nitrite: NEGATIVE
Protein, ur: NEGATIVE mg/dL
Specific Gravity, Urine: >1.03 — ABNORMAL HIGH (ref 1.005–1.030)
pH: 6 (ref 5.0–8.0)

## 2019-10-07 LAB — URINALYSIS, MICROSCOPIC (REFLEX)

## 2019-10-07 LAB — SARS CORONAVIRUS 2 BY RT PCR (HOSPITAL ORDER, PERFORMED IN ~~LOC~~ HOSPITAL LAB): SARS Coronavirus 2: POSITIVE — AB

## 2019-10-07 LAB — PREGNANCY, URINE: Preg Test, Ur: NEGATIVE

## 2019-10-07 MED ORDER — NAPROXEN 500 MG PO TABS: 500.0000 mg | ORAL_TABLET | Freq: Two times a day (BID) | ORAL | 0 refills | Status: DC

## 2019-10-07 MED ORDER — NAPROXEN 250 MG PO TABS
500.0000 mg | ORAL_TABLET | Freq: Once | ORAL | Status: AC
  Administered 2019-10-07: 500 mg via ORAL
  Filled 2019-10-07: qty 2

## 2019-10-07 NOTE — Discharge Instructions (Signed)
You were evaluated in the Emergency Department and after careful evaluation, we did not find any emergent condition requiring admission or further testing in the hospital.  Your exam/testing today was overall reassuring.  Your symptoms seem to be due to a viral illness, likely COVID-19.  Please continue home isolation at home until you are without fever for 3 days.  You can use the anti-inflammatory prescribed as needed for discomfort.  Please return to the Emergency Department if you experience any worsening of your condition.  Thank you for allowing [place] to be a part of your care.

## 2019-10-07 NOTE — ED Triage Notes (Signed)
Pt arrives with lower abdominal pain at c-section line (last C-section 2018) pt reports spasms in that area since.

## 2019-10-07 NOTE — ED Provider Notes (Signed)
[facility] Emergency Department Provider Note MRN:  [id number]  Arrival date & time: 10/07/19     Chief Complaint   Body aches History of Present Illness   Stephanie Clayton is a 34 y.o. year-old female with no pertinent past medical history presenting to the ED with chief complaint of body aches.  Body aches for the past 2 weeks, located diffusely but worst in the back, buttocks, groin bilateral.  Had some cough and fever 1 week ago.  Patient's child's father tested positive for Covid recently and she has been exposed to him.  No chest pain or shortness of breath, no abdominal pain, no burning with urination, no numbness or weakness.  Feels generally unwell, malaise, fatigue.  Review of Systems  A complete 10 system review of systems was obtained and all systems are negative except as noted in the HPI and PMH.   Patient's Health History    Past Medical History:  Diagnosis Date  . Sciatic leg pain     Past Surgical History:  Procedure Laterality Date  . CESAREAN SECTION    . CHOLECYSTECTOMY    . TUBAL LIGATION      No family history on file.  Social History   Socioeconomic History  . Marital status: Married    Spouse name: Not on file  . Number of children: Not on file  . Years of education: Not on file  . Highest education level: Not on file  Occupational History  . Not on file  Tobacco Use  . Smoking status: Former Smoker    Packs/day: 0.50    Types: Cigarettes  . Smokeless tobacco: Never Used  Vaping Use  . Vaping Use: Never used  Substance and Sexual Activity  . Alcohol use: No  . Drug use: No  . Sexual activity: Not on file  Other Topics Concern  . Not on file  Social History Narrative  . Not on file   Social Determinants of Health   Financial Resource Strain:   . Difficulty of Paying Living Expenses:   Food Insecurity:   . Worried About Charity fundraiser in the Last Year:   . Arboriculturist in the Last Year:     Transportation Needs:   . Film/video editor (Medical):   Marland Kitchen Lack of Transportation (Non-Medical):   Physical Activity:   . Days of Exercise per Week:   . Minutes of Exercise per Session:   Stress:   . Feeling of Stress :   Social Connections:   . Frequency of Communication with Friends and Family:   . Frequency of Social Gatherings with Friends and Family:   . Attends Religious Services:   . Active Member of Clubs or Organizations:   . Attends Archivist Meetings:   Marland Kitchen Marital Status:   Intimate Partner Violence:   . Fear of Current or Ex-Partner:   . Emotionally Abused:   Marland Kitchen Physically Abused:   . Sexually Abused:      Physical Exam   Vitals:   10/07/19 0829 10/07/19 0834  BP: (!) 126/93 (!) 126/93  Pulse: 78 78  Resp: 18 18  Temp: 98.6 F (37 C) 98.6 F (37 C)  SpO2: 98% 98%    CONSTITUTIONAL: Well-appearing, NAD NEURO:  Alert and oriented x 3, no focal deficits EYES:  eyes equal and reactive ENT/NECK:  no LAD, no JVD CARDIO: Regular rate, well-perfused, normal S1 and S2 PULM:  CTAB no wheezing or rhonchi  GI/GU:  normal bowel sounds, non-distended, non-tender MSK/SPINE:  No gross deformities, no edema SKIN:  no rash, atraumatic PSYCH:  Appropriate speech and behavior  *Additional and/or pertinent findings included in MDM below  Diagnostic and Interventional Summary    EKG Interpretation  Date/Time:    Ventricular Rate:    PR Interval:    QRS Duration:   QT Interval:    QTC Calculation:   R Axis:     Text Interpretation:        Labs Reviewed  URINALYSIS, ROUTINE W REFLEX MICROSCOPIC - Abnormal; Notable for the following components:      Result Value   Specific Gravity, Urine >1.030 (*)    Hgb urine dipstick TRACE (*)    All other components within normal limits  URINALYSIS, MICROSCOPIC (REFLEX) - Abnormal; Notable for the following components:   Bacteria, UA FEW (*)    All other components within normal limits  SARS CORONAVIRUS 2  BY RT PCR (HOSPITAL ORDER, [facility])  PREGNANCY, URINE    No orders to display    Medications  naproxen (NAPROSYN) tablet 500 mg (has no administration in time range)     Procedures  /  Critical Care Procedures  ED Course and Medical Decision Making  I have reviewed the triage vital signs, the nursing notes, and pertinent available records from the EMR.  Listed above are laboratory and imaging tests that I personally ordered, reviewed, and interpreted and then considered in my medical decision making (see below for details).  Suspect COVID-19 given myriad of symptoms and exposure, well-appearing, normal vital signs, no hypoxia, no increased work of breathing, clear lungs, no abdominal tenderness, appropriate for discharge with reassurance and anti-inflammatories.       Barth Kirks. Sedonia Small, MD [facility] [email or web address]  Final Clinical Impressions(s) / ED Diagnoses     ICD-10-CM   1. Suspected 2019 novel coronavirus infection  Z20.822     ED Discharge Orders         Ordered    naproxen (NAPROSYN) 500 MG tablet  2 times daily     Discontinue  Reprint     10/07/19 0900           Discharge Instructions Discussed with and Provided to Patient:     Discharge Instructions     You were evaluated in the Emergency Department and after careful evaluation, we did not find any emergent condition requiring admission or further testing in the hospital.  Your exam/testing today was overall reassuring.  Your symptoms seem to be due to a viral illness, likely COVID-19.  Please continue home isolation at home until you are without fever for 3 days.  You can use the anti-inflammatory prescribed as needed for discomfort.  Please return to the Emergency Department if you experience any worsening of your condition.  Thank you for allowing [place] to be a part of your care.       Maudie Flakes, MD 10/07/19  [phone]

## 2019-10-07 NOTE — ED Triage Notes (Signed)
Pt also reports sciatic nerve pain and states that she would like to be tested for Covid because she has been around someone with symptoms.

## 2019-10-16 ENCOUNTER — Telehealth (HOSPITAL_COMMUNITY): Payer: Self-pay

## 2019-10-23 ENCOUNTER — Other Ambulatory Visit: Payer: Medicaid Other

## 2019-10-23 ENCOUNTER — Other Ambulatory Visit: Payer: Self-pay

## 2019-10-23 DIAGNOSIS — Z20822 Contact with and (suspected) exposure to covid-19: Secondary | ICD-10-CM

## 2019-10-24 LAB — NOVEL CORONAVIRUS, NAA: SARS-CoV-2, NAA: NOT DETECTED

## 2020-05-13 ENCOUNTER — Emergency Department (HOSPITAL_BASED_OUTPATIENT_CLINIC_OR_DEPARTMENT_OTHER)
Admission: EM | Admit: 2020-05-13 | Discharge: 2020-05-13 | Disposition: A | Discharge status: Home / Self Care | Payer: Medicaid Other | Attending: Emergency Medicine | Admitting: Emergency Medicine

## 2020-05-13 ENCOUNTER — Other Ambulatory Visit: Payer: Self-pay

## 2020-05-13 ENCOUNTER — Encounter (HOSPITAL_BASED_OUTPATIENT_CLINIC_OR_DEPARTMENT_OTHER): Payer: Self-pay

## 2020-05-13 DIAGNOSIS — M5442 Lumbago with sciatica, left side: Secondary | ICD-10-CM | POA: Diagnosis not present

## 2020-05-13 DIAGNOSIS — R109 Unspecified abdominal pain: Secondary | ICD-10-CM | POA: Insufficient documentation

## 2020-05-13 DIAGNOSIS — N6459 Other signs and symptoms in breast: Secondary | ICD-10-CM | POA: Diagnosis not present

## 2020-05-13 DIAGNOSIS — N3000 Acute cystitis without hematuria: Secondary | ICD-10-CM | POA: Insufficient documentation

## 2020-05-13 DIAGNOSIS — M25512 Pain in left shoulder: Secondary | ICD-10-CM | POA: Diagnosis not present

## 2020-05-13 DIAGNOSIS — N6452 Nipple discharge: Secondary | ICD-10-CM | POA: Insufficient documentation

## 2020-05-13 DIAGNOSIS — Z87891 Personal history of nicotine dependence: Secondary | ICD-10-CM | POA: Insufficient documentation

## 2020-05-13 DIAGNOSIS — N63 Unspecified lump in unspecified breast: Secondary | ICD-10-CM

## 2020-05-13 DIAGNOSIS — M5431 Sciatica, right side: Secondary | ICD-10-CM

## 2020-05-13 LAB — CBC WITH DIFFERENTIAL/PLATELET
Abs Immature Granulocytes: 0.01 10*3/uL (ref 0.00–0.07)
Basophils Absolute: 0 10*3/uL (ref 0.0–0.1)
Basophils Relative: 0 %
Eosinophils Absolute: 0.1 10*3/uL (ref 0.0–0.5)
Eosinophils Relative: 2 %
HCT: 37.6 % (ref 36.0–46.0)
Hemoglobin: 12.2 g/dL (ref 12.0–15.0)
Immature Granulocytes: 0 %
Lymphocytes Relative: 29 %
Lymphs Abs: 2.2 10*3/uL (ref 0.7–4.0)
MCH: 26.8 pg (ref 26.0–34.0)
MCHC: 32.4 g/dL (ref 30.0–36.0)
MCV: 82.5 fL (ref 80.0–100.0)
Monocytes Absolute: 0.6 10*3/uL (ref 0.1–1.0)
Monocytes Relative: 8 %
Neutro Abs: 4.7 10*3/uL (ref 1.7–7.7)
Neutrophils Relative %: 61 %
Platelets: 290 10*3/uL (ref 150–400)
RBC: 4.56 MIL/uL (ref 3.87–5.11)
RDW: 14.6 % (ref 11.5–15.5)
WBC: 7.6 10*3/uL (ref 4.0–10.5)
nRBC: 0 % (ref 0.0–0.2)

## 2020-05-13 LAB — COMPREHENSIVE METABOLIC PANEL
ALT: 12 U/L (ref 0–44)
AST: 19 U/L (ref 15–41)
Albumin: 3.9 g/dL (ref 3.5–5.0)
Alkaline Phosphatase: 61 U/L (ref 38–126)
Anion gap: 7 (ref 5–15)
BUN: 14 mg/dL (ref 6–20)
CO2: 26 mmol/L (ref 22–32)
Calcium: 8.9 mg/dL (ref 8.9–10.3)
Chloride: 102 mmol/L (ref 98–111)
Creatinine, Ser: 0.75 mg/dL (ref 0.44–1.00)
GFR, Estimated: >60 mL/min (ref >60)
Glucose, Bld: 91 mg/dL (ref 70–99)
Potassium: 3.5 mmol/L (ref 3.5–5.1)
Sodium: 135 mmol/L (ref 135–145)
Total Bilirubin: 0.6 mg/dL (ref 0.3–1.2)
Total Protein: 7.7 g/dL (ref 6.5–8.1)

## 2020-05-13 LAB — URINALYSIS, ROUTINE W REFLEX MICROSCOPIC
Bilirubin Urine: NEGATIVE
Glucose, UA: NEGATIVE mg/dL
Hgb urine dipstick: NEGATIVE
Ketones, ur: NEGATIVE mg/dL
Leukocytes,Ua: NEGATIVE
Nitrite: POSITIVE — AB
Protein, ur: NEGATIVE mg/dL
Specific Gravity, Urine: 1.025 (ref 1.005–1.030)
pH: 6.5 (ref 5.0–8.0)

## 2020-05-13 LAB — URINALYSIS, MICROSCOPIC (REFLEX)

## 2020-05-13 LAB — LIPASE, BLOOD: Lipase: 29 U/L (ref 11–51)

## 2020-05-13 LAB — PREGNANCY, URINE: Preg Test, Ur: NEGATIVE

## 2020-05-13 MED ORDER — NAPROXEN 500 MG PO TABS: 500.0000 mg | ORAL_TABLET | Freq: Two times a day (BID) | ORAL | 0 refills | Status: DC

## 2020-05-13 MED ORDER — KETOROLAC TROMETHAMINE 15 MG/ML IJ SOLN
15.0000 mg | Freq: Once | INTRAMUSCULAR | Status: AC
  Administered 2020-05-13: 15 mg via INTRAMUSCULAR
  Filled 2020-05-13: qty 1

## 2020-05-13 MED ORDER — CEPHALEXIN 250 MG PO CAPS
500.0000 mg | ORAL_CAPSULE | Freq: Once | ORAL | Status: AC
  Administered 2020-05-13: 500 mg via ORAL
  Filled 2020-05-13: qty 2

## 2020-05-13 MED ORDER — CEPHALEXIN 500 MG PO CAPS: 500.0000 mg | ORAL_CAPSULE | Freq: Three times a day (TID) | ORAL | 0 refills | Status: AC

## 2020-05-13 MED ORDER — ACETAMINOPHEN 325 MG PO TABS
650.0000 mg | ORAL_TABLET | Freq: Once | ORAL | Status: AC
  Administered 2020-05-13: 650 mg via ORAL
  Filled 2020-05-13: qty 2

## 2020-05-13 NOTE — Discharge Instructions (Addendum)
You were seen today for multiple complaints.  Take naproxen for your known sciatica.  Take the antibiotic provided for urinary tract infection.  Follow-up closely with your primary physician for ongoing assessment.

## 2020-05-13 NOTE — ED Notes (Signed)
ED Provider at bedside. 

## 2020-05-13 NOTE — ED Triage Notes (Signed)
Pt presents with multiple complaints. States she has bilateral flank pain,  bilateral swollen breasts with clear discharge, L shoulder pain and "issues with my sciatic nerve". Reports these symptoms have been ongoing for the past couple of weeks.

## 2020-05-13 NOTE — ED Provider Notes (Signed)
[facility] EMERGENCY DEPARTMENT Provider Note   CSN: [id number] Arrival date & time: 05/13/20  0015     History Chief Complaint  Patient presents with  . Flank Pain    Stephanie Clayton is a 35 y.o. female.  HPI     This is a 35 year old female with a history of sciatica who presents with multiple complaints.  Patient reports multiple ongoing complaints for "some time."  She describes swollen breast with bilateral clear breast discharge, tender swollen abdomen, flank pain, left shoulder pain, and "my sciatic nerve is worse."  She states that she has not seen her primary physician.  She does take medication at home but cannot tell me what that medication is.  She states that her pain currently is 5 out of 10 dominantly from her sciatic nerve.  She described pain in the back that radiates into the right buttock and the right leg.  She states is worse with standing.  No weakness, numbness, tingling of the lower extremities.  She denies any dysuria or hematuria.  She is unsure whether she is pregnant.  She reports that she had both one positive and one negative pregnancy test 1 week ago so "I do not know."  She reports that she had vaginal bleeding consistent with her menstrual period this past Thursday.  However, she describes intermittent vaginal bleeding with prior pregnancy.  Denies specific urinary complaints but reports intermittent flank pain.  Is mostly on the right side.  It is nonradiating.  No fevers.  Past Medical History:  Diagnosis Date  . Sciatic leg pain     Patient Active Problem List   Diagnosis Date Noted  . TOBACCO ABUSE 05/14/2007  . ABSENCE OF MENSTRUATION 05/14/2007  . PAIN IN SOFT TISSUES OF LIMB 03/16/2007    Past Surgical History:  Procedure Laterality Date  . CESAREAN SECTION    . CHOLECYSTECTOMY    . TUBAL LIGATION       OB History    Gravida  1   Para      Term      Preterm      AB      Living        SAB      IAB       Ectopic      Multiple      Live Births              No family history on file.  Social History   Tobacco Use  . Smoking status: Former Smoker    Packs/day: 0.50    Types: Cigarettes  . Smokeless tobacco: Never Used  Vaping Use  . Vaping Use: Every day  Substance Use Topics  . Alcohol use: Yes  . Drug use: No    Home Medications Prior to Admission medications   Medication Sig Start Date End Date Taking? Authorizing Provider  cephALEXin (KEFLEX) 500 MG capsule Take 1 capsule (500 mg total) by mouth 3 (three) times daily. 05/13/20  Yes Horton, Mayer Masker, MD  naproxen (NAPROSYN) 500 MG tablet Take 1 tablet (500 mg total) by mouth 2 (two) times daily. 05/13/20  Yes Horton, Mayer Masker, MD  HYDROcodone-acetaminophen (NORCO) 5-325 MG tablet Take 1 tablet by mouth every 4 (four) hours as needed for severe pain (for pain). 06/13/19   Molpus, Jonny Ruiz, MD    Allergies    Patient has no known allergies.  Review of Systems   Review of Systems  Constitutional: Negative for fever.  Respiratory: Negative for shortness of breath.   Cardiovascular: Negative for chest pain.  Gastrointestinal: Positive for abdominal distention. Negative for diarrhea, nausea and vomiting.  Genitourinary: Negative for dysuria.  Musculoskeletal: Positive for back pain.       Shoulder pain  Neurological: Negative for weakness and numbness.  All other systems reviewed and are negative.   Physical Exam Updated Vital Signs BP 131/87   Pulse 71   Temp 98.3 F (36.8 C) (Oral)   Resp 18   Ht 1.626 m (5\' 4" )   Wt 88 kg   LMP 05/07/2020 (Exact Date)   SpO2 100%   BMI 33.30 kg/m   Physical Exam Vitals and nursing note reviewed. Exam conducted with a chaperone present.  Constitutional:      Appearance: She is well-developed. She is obese.  HENT:     Head: Normocephalic and atraumatic.  Eyes:     Pupils: Pupils are equal, round, and reactive to light.  Cardiovascular:     Rate and Rhythm: Normal  rate and regular rhythm.     Heart sounds: Normal heart sounds.  Pulmonary:     Effort: Pulmonary effort is normal. No respiratory distress.     Breath sounds: No wheezing.  Chest:     Chest wall: No mass.  Breasts:     Right: No swelling, inverted nipple, mass, nipple discharge or tenderness.     Left: No swelling, inverted nipple, mass, nipple discharge or tenderness.    Abdominal:     General: Bowel sounds are normal.     Palpations: Abdomen is soft.     Tenderness: There is no right CVA tenderness or left CVA tenderness.  Musculoskeletal:     Cervical back: Neck supple.  Skin:    General: Skin is warm and dry.  Neurological:     Mental Status: She is alert and oriented to person, place, and time.     Comments: Positive right straight leg raise, 5 out of 5 strength bilateral lower extremities, no clonus  Psychiatric:        Mood and Affect: Mood normal.     ED Results / Procedures / Treatments   Labs (all labs ordered are listed, but only abnormal results are displayed) Labs Reviewed  URINALYSIS, ROUTINE W REFLEX MICROSCOPIC - Abnormal; Notable for the following components:      Result Value   Nitrite POSITIVE (*)    All other components within normal limits  URINALYSIS, MICROSCOPIC (REFLEX) - Abnormal; Notable for the following components:   Bacteria, UA FEW (*)    All other components within normal limits  URINE CULTURE  PREGNANCY, URINE  CBC WITH DIFFERENTIAL/PLATELET  COMPREHENSIVE METABOLIC PANEL  LIPASE, BLOOD    EKG None  Radiology No results found.  Procedures Procedures   Medications Ordered in ED Medications  acetaminophen (TYLENOL) tablet 650 mg (650 mg Oral Given 05/13/20 0057)  ketorolac (TORADOL) 15 MG/ML injection 15 mg (15 mg Intramuscular Given 05/13/20 0141)  cephALEXin (KEFLEX) capsule 500 mg (500 mg Oral Given 05/13/20 0140)    ED Course  I have reviewed the triage vital signs and the nursing notes.  Pertinent labs & imaging results  that were available during my care of the patient were reviewed by me and considered in my medical decision making (see chart for details).    MDM Rules/Calculators/A&P                          Patient  presents with multiple complaints.  She is difficult to nail down and history taking.  Mostly they appear chronic.  Vital signs reviewed are reassuring.  She is afebrile.  She has a fairly benign exam.  I do not see any changes in her breast that are asymmetric, no overlying skin changes to suggest infection.  No obvious nipple discharge.  Additionally her abdominal and flank exams are benign.  She does have a positive right straight leg raise but otherwise her neuro exam is benign.  Basic lab work initiated.  No leukocytosis.  No significant metabolic derangements.  Lipase is normal.  She does have nitrite positive urine with some bacteria.  We will culture and treat given flank pain.  Additionally she was given anti-inflammatories for sciatica.  No signs or symptoms of cauda equina.  Recommend follow-up with her primary physician for ongoing evaluation given multiple complaints.  Do not feel she needs any further emergent work-up at this time.  After history, exam, and medical workup I feel the patient has been appropriately medically screened and is safe for discharge home. Pertinent diagnoses were discussed with the patient. Patient was given return precautions.  Final Clinical Impression(s) / ED Diagnoses Final diagnoses:  Acute cystitis without hematuria  Sciatica of right side  Breast swelling    Rx / DC Orders ED Discharge Orders         Ordered    cephALEXin (KEFLEX) 500 MG capsule  3 times daily        05/13/20 0155    naproxen (NAPROSYN) 500 MG tablet  2 times daily        05/13/20 0155           Horton, Mayer Masker, MD 05/13/20 [phone]

## 2020-05-14 LAB — URINE CULTURE: Culture: NO GROWTH

## 2020-07-10 ENCOUNTER — Other Ambulatory Visit: Payer: Self-pay

## 2020-07-10 ENCOUNTER — Encounter (HOSPITAL_BASED_OUTPATIENT_CLINIC_OR_DEPARTMENT_OTHER): Payer: Self-pay

## 2020-07-10 ENCOUNTER — Emergency Department (HOSPITAL_BASED_OUTPATIENT_CLINIC_OR_DEPARTMENT_OTHER)
Admission: EM | Admit: 2020-07-10 | Discharge: 2020-07-10 | Disposition: A | Discharge status: Home / Self Care | Payer: Medicaid Other | Attending: Emergency Medicine | Admitting: Emergency Medicine

## 2020-07-10 DIAGNOSIS — G8929 Other chronic pain: Secondary | ICD-10-CM | POA: Diagnosis not present

## 2020-07-10 DIAGNOSIS — M545 Low back pain, unspecified: Secondary | ICD-10-CM | POA: Diagnosis not present

## 2020-07-10 DIAGNOSIS — Z87891 Personal history of nicotine dependence: Secondary | ICD-10-CM | POA: Insufficient documentation

## 2020-07-10 HISTORY — DX: Prediabetes: R73.03

## 2020-07-10 MED ORDER — NAPROXEN 500 MG PO TABS: 500.0000 mg | ORAL_TABLET | Freq: Two times a day (BID) | ORAL | 0 refills | Status: DC

## 2020-07-10 MED ORDER — ACETAMINOPHEN 325 MG PO TABS
650.0000 mg | ORAL_TABLET | Freq: Once | ORAL | Status: AC
  Administered 2020-07-10: 650 mg via ORAL
  Filled 2020-07-10: qty 2

## 2020-07-10 MED ORDER — METHOCARBAMOL 500 MG PO TABS: 500.0000 mg | ORAL_TABLET | Freq: Two times a day (BID) | ORAL | 0 refills | Status: DC

## 2020-07-10 MED ORDER — METHOCARBAMOL 500 MG PO TABS
500.0000 mg | ORAL_TABLET | Freq: Once | ORAL | Status: AC
  Administered 2020-07-10: 500 mg via ORAL
  Filled 2020-07-10: qty 1

## 2020-07-10 MED ORDER — LIDOCAINE 5 % EX PTCH
2.0000 | MEDICATED_PATCH | CUTANEOUS | Status: DC
  Administered 2020-07-10: 2 via TRANSDERMAL
  Filled 2020-07-10: qty 2

## 2020-07-10 NOTE — ED Triage Notes (Addendum)
Pt c/o "sciatic nerve" pain x 2 weeks-NAD-steady slow gait

## 2020-07-10 NOTE — ED Provider Notes (Signed)
[facility] EMERGENCY DEPARTMENT Provider Note   CSN: [id number] Arrival date & time: 07/10/20  1849     History Chief Complaint  Patient presents with  . Sciatica    Stephanie Clayton is a 35 y.o. female with a past medical history significant for prediabetes and tobacco abuse who presents to the ED due to bilateral low back pain that radiates to bilateral buttocks region.  Patient states pain has been present for 4 years however, is worsened over the past 2 weeks.  She notes she has been doing low back exercises with no improvement.  She has been taking over-the-counter pain medication with mild relief.  Denies saddle paresthesias, bowel/bladder incontinence, lower extremity numbness/tingling, lower extremity weakness, fever/chills, and IV drug use.  Denies abdominal pain.  Pain is worse with movement especially ambulation.  She rates her pain a 10/10.   History obtained from patient and past medical records. No interpreter used during encounter.      Past Medical History:  Diagnosis Date  . Prediabetes   . Sciatic leg pain     Patient Active Problem List   Diagnosis Date Noted  . TOBACCO ABUSE 05/14/2007  . ABSENCE OF MENSTRUATION 05/14/2007  . PAIN IN SOFT TISSUES OF LIMB 03/16/2007    Past Surgical History:  Procedure Laterality Date  . CESAREAN SECTION    . CHOLECYSTECTOMY    . TUBAL LIGATION       OB History    Gravida  1   Para      Term      Preterm      AB      Living        SAB      IAB      Ectopic      Multiple      Live Births              No family history on file.  Social History   Tobacco Use  . Smoking status: Former Smoker    Packs/day: 0.50    Types: Cigarettes  . Smokeless tobacco: Never Used  Vaping Use  . Vaping Use: Every day  Substance Use Topics  . Alcohol use: Yes    Comment: occ  . Drug use: No    Home Medications Prior to Admission medications   Medication Sig Start Date End Date Taking?  Authorizing Provider  methocarbamol (ROBAXIN) 500 MG tablet Take 1 tablet (500 mg total) by mouth 2 (two) times daily. 07/10/20  Yes Danaysia Rader C, PA-C  naproxen (NAPROSYN) 500 MG tablet Take 1 tablet (500 mg total) by mouth 2 (two) times daily. 07/10/20  Yes Taija Mathias, Merla Riches, PA-C  cephALEXin (KEFLEX) 500 MG capsule Take 1 capsule (500 mg total) by mouth 3 (three) times daily. 05/13/20   Horton, Mayer Masker, MD  HYDROcodone-acetaminophen (NORCO) 5-325 MG tablet Take 1 tablet by mouth every 4 (four) hours as needed for severe pain (for pain). 06/13/19   Molpus, John, MD  naproxen (NAPROSYN) 500 MG tablet Take 1 tablet (500 mg total) by mouth 2 (two) times daily. 05/13/20   Horton, Mayer Masker, MD    Allergies    Patient has no known allergies.  Review of Systems   Review of Systems  Constitutional: Negative for fever.  Gastrointestinal: Negative for abdominal pain.  Musculoskeletal: Positive for back pain and gait problem.  Neurological: Negative for numbness.  All other systems reviewed and are negative.   Physical Exam Updated Vital Signs  BP (!) 142/98 (BP Location: Right Arm)   Pulse 78   Temp 98.3 F (36.8 C) (Oral)   Resp 18   Ht 5\' 4"  (1.626 m)   Wt 93 kg   LMP 07/05/2020   SpO2 100%   BMI 35.19 kg/m   Physical Exam Vitals and nursing note reviewed.  Constitutional:      General: She is not in acute distress.    Appearance: She is not ill-appearing.  HENT:     Head: Normocephalic.  Eyes:     Pupils: Pupils are equal, round, and reactive to light.  Neck:     Comments: No cervical midline tenderness. Cardiovascular:     Rate and Rhythm: Normal rate and regular rhythm.     Pulses: Normal pulses.     Heart sounds: Normal heart sounds. No murmur heard. No friction rub. No gallop.   Pulmonary:     Effort: Pulmonary effort is normal.     Breath sounds: Normal breath sounds.  Abdominal:     General: Abdomen is flat. There is no distension.     Palpations:  Abdomen is soft.     Tenderness: There is no abdominal tenderness. There is no guarding or rebound.  Musculoskeletal:        General: Normal range of motion.     Cervical back: Neck supple.     Comments: No thoracic or lumbar midline tenderness.  Reproducible bilateral lumbar paraspinal tenderness.  Bilateral lower extremities neurovascularly intact.  Skin:    General: Skin is warm and dry.  Neurological:     General: No focal deficit present.     Mental Status: She is alert.  Psychiatric:        Mood and Affect: Mood normal.        Behavior: Behavior normal.     ED Results / Procedures / Treatments   Labs (all labs ordered are listed, but only abnormal results are displayed) Labs Reviewed - No data to display  EKG None  Radiology No results found.  Procedures Procedures   Medications Ordered in ED Medications  lidocaine (LIDODERM) 5 % 2 patch (2 patches Transdermal Patch Applied 07/10/20 2104)  methocarbamol (ROBAXIN) tablet 500 mg (500 mg Oral Given 07/10/20 2104)  acetaminophen (TYLENOL) tablet 650 mg (650 mg Oral Given 07/10/20 2104)    ED Course  I have reviewed the triage vital signs and the nursing notes.  Pertinent labs & imaging results that were available during my care of the patient were reviewed by me and considered in my medical decision making (see chart for details).    MDM Rules/Calculators/A&P                         35 year old female presents to the ED due to acute on chronic low back pain that radiates to buttocks region. No red flags.  Upon arrival, patient afebrile, not tachycardic or hypoxic.  Patient in no acute distress.  Physical exam significant for reproducible bilateral lumbar paraspinal tenderness.  No midline tenderness.  Bilateral lower extremities neurovascularly intact.  Low suspicion for cauda equina or central cord compression.  Patient treated with Lidoderm patches, Robaxin, and Tylenol with symptomatic relief. Patient discharged with  symptomatic treatment.  Low back exercises given to patient.  Advised patient follow-up with PCP if symptoms not improved within the next week. Strict ED precautions discussed with patient. Patient states understanding and agrees to plan. Patient discharged home in no acute distress and  stable vitals  Final Clinical Impression(s) / ED Diagnoses Final diagnoses:  Acute bilateral low back pain without sciatica    Rx / DC Orders ED Discharge Orders         Ordered    methocarbamol (ROBAXIN) 500 MG tablet  2 times daily        07/10/20 2213    naproxen (NAPROSYN) 500 MG tablet  2 times daily        07/10/20 2213           Jesusita Oka 07/10/20 2220    Koleen Distance, MD 07/10/20 [phone]

## 2020-07-10 NOTE — Discharge Instructions (Addendum)
As discussed, low back pain can take up to 6 weeks to improve.  I am sending you home with a pain medication and muscle relaxer.  Take as prescribed.  Do not mix with other over-the-counter medications.  Muscle relaxer can cause drowsiness so do not drive or operate machinery while on the medication.  I have included low back exercises.  Do daily.  Please follow-up with PCP symptoms not improved within the next week.  Return to the ER for new or worsening symptoms.  You may also purchase over-the-counter Lidoderm patches and Voltaren gel for added pain relief.

## 2021-03-24 ENCOUNTER — Other Ambulatory Visit: Payer: Self-pay

## 2021-03-24 ENCOUNTER — Emergency Department (HOSPITAL_BASED_OUTPATIENT_CLINIC_OR_DEPARTMENT_OTHER)
Admission: EM | Admit: 2021-03-24 | Discharge: 2021-03-25 | Disposition: A | Discharge status: Home / Self Care | Payer: Medicaid Other | Attending: Emergency Medicine | Admitting: Emergency Medicine

## 2021-03-24 ENCOUNTER — Encounter (HOSPITAL_BASED_OUTPATIENT_CLINIC_OR_DEPARTMENT_OTHER): Payer: Self-pay | Admitting: *Deleted

## 2021-03-24 DIAGNOSIS — R509 Fever, unspecified: Secondary | ICD-10-CM | POA: Diagnosis present

## 2021-03-24 DIAGNOSIS — E119 Type 2 diabetes mellitus without complications: Secondary | ICD-10-CM | POA: Diagnosis not present

## 2021-03-24 DIAGNOSIS — M791 Myalgia, unspecified site: Secondary | ICD-10-CM | POA: Insufficient documentation

## 2021-03-24 DIAGNOSIS — J069 Acute upper respiratory infection, unspecified: Secondary | ICD-10-CM | POA: Diagnosis not present

## 2021-03-24 NOTE — ED Notes (Signed)
Pt is c/o chills, body aches and cough

## 2021-03-24 NOTE — ED Provider Notes (Signed)
[facility] EMERGENCY DEPARTMENT Provider Note   CSN: [id number] Arrival date & time: 03/24/21  2303     History  Chief Complaint  Patient presents with   Fever    Stephanie Clayton is a 36 y.o. female patient complains of chills, body aches, cough, fever for 1 to 2 days.  Patient reports she has a past medical history of diabetes, no history of heart or lung conditions.  Patient reports some pain in chest with coughing, no chest pain at rest.  No shortness of breath at rest.  Patient has taken some Robitussin for her symptoms with some relief.  Her primary concern today is her body aches.   Fever Associated symptoms: myalgias       Home Medications Prior to Admission medications   Medication Sig Start Date End Date Taking? Authorizing Provider  cephALEXin (KEFLEX) 500 MG capsule Take 1 capsule (500 mg total) by mouth 3 (three) times daily. 05/13/20   Horton, Barbette Hair, MD  HYDROcodone-acetaminophen (NORCO) 5-325 MG tablet Take 1 tablet by mouth every 4 (four) hours as needed for severe pain (for pain). 06/13/19   Molpus, John, MD  methocarbamol (ROBAXIN) 500 MG tablet Take 1 tablet (500 mg total) by mouth 2 (two) times daily. 07/10/20   Suzy Bouchard, PA-C  naproxen (NAPROSYN) 500 MG tablet Take 1 tablet (500 mg total) by mouth 2 (two) times daily. 05/13/20   Horton, Barbette Hair, MD  naproxen (NAPROSYN) 500 MG tablet Take 1 tablet (500 mg total) by mouth 2 (two) times daily. 07/10/20   Suzy Bouchard, PA-C      Allergies    Patient has no known allergies.    Review of Systems   Review of Systems  Constitutional:  Positive for fever.  Musculoskeletal:  Positive for myalgias.  All other systems reviewed and are negative.  Physical Exam Updated Vital Signs BP 94/81 (BP Location: Left Arm)    Pulse 87    Temp 98.8 F (37.1 C) (Oral)    Resp 18    Ht 5\' 4"  (1.626 m)    Wt 89.8 kg    LMP 03/24/2021    SpO2 100%    BMI 33.99 kg/m  Physical Exam Vitals and  nursing note reviewed.  Constitutional:      General: She is not in acute distress.    Appearance: Normal appearance.  HENT:     Head: Normocephalic and atraumatic.  Eyes:     General:        Right eye: No discharge.        Left eye: No discharge.  Cardiovascular:     Rate and Rhythm: Normal rate and regular rhythm.  Pulmonary:     Effort: Pulmonary effort is normal. No respiratory distress.     Comments: No accessory breath sounds, no respiratory distress, no stridor, no wheezing. Musculoskeletal:        General: No deformity.  Skin:    General: Skin is warm and dry.  Neurological:     Mental Status: She is alert and oriented to person, place, and time.  Psychiatric:        Mood and Affect: Mood normal.        Behavior: Behavior normal.    ED Results / Procedures / Treatments   Labs (all labs ordered are listed, but only abnormal results are displayed) Labs Reviewed - No data to display  EKG None  Radiology No results found.  Procedures Procedures    Medications  Ordered in ED Medications - No data to display  ED Course/ Medical Decision Making/ A&P                           Medical Decision Making  Is an overall well-appearing 36 year old female who has had 2 days of what sound like upper respiratory infectious symptoms.  Her physical exam is overall unremarkable although she does appear to be in some discomfort secondary to body aches.  She has taken some Robitussin prior to arrival.  Discussed we could run a COVID, flu swab at this time.  Patient reports that she had significant pain in the nose last time she had a swab and declines at this time.  Patient requesting a blood test for COVID.  Informed patient that we do not have a blood test for COVID.  Discussed with patient that based on her overall clinical appearance I highly suspect a viral upper respiratory infection, and recommend supportive care with ibuprofen, Tylenol, rest, over-the-counter cough and cold  medications.  Encouraged follow-up with patient symptoms worsen or fail to improve despite treatment.  Discharged in stable condition at this time. Final Clinical Impression(s) / ED Diagnoses Final diagnoses:  Viral upper respiratory tract infection    Rx / DC Orders ED Discharge Orders     None         Anselmo Pickler, PA-C 03/24/21 2359    Veryl Speak, MD 03/25/21 [phone]

## 2021-03-24 NOTE — ED Triage Notes (Signed)
C/o fever , cough , runny nose x 2 days

## 2021-03-24 NOTE — Discharge Instructions (Signed)
Please use Tylenol or ibuprofen for pain.  You may use 600 mg ibuprofen every 6 hours or 1000 mg of Tylenol every 6 hours.  You may choose to alternate between the 2.  This would be most effective.  Not to exceed 4 g of Tylenol within 24 hours.  Not to exceed 3200 mg ibuprofen 24 hours.  Condition the above you can take some Mucinex, Robitussin, use some heating pads, plenty of fluids.  Please rest and take it easy for the next few days.

## 2021-03-25 NOTE — ED Notes (Signed)
Written and verbal inst to pt  Verbalized an understanding  To home  

## 2021-04-21 ENCOUNTER — Encounter (HOSPITAL_BASED_OUTPATIENT_CLINIC_OR_DEPARTMENT_OTHER): Payer: Self-pay | Admitting: Urology

## 2021-04-21 ENCOUNTER — Emergency Department (HOSPITAL_BASED_OUTPATIENT_CLINIC_OR_DEPARTMENT_OTHER)
Admission: EM | Admit: 2021-04-21 | Discharge: 2021-04-21 | Disposition: A | Discharge status: Home / Self Care | Payer: Medicaid Other | Attending: Emergency Medicine | Admitting: Emergency Medicine

## 2021-04-21 ENCOUNTER — Emergency Department (HOSPITAL_BASED_OUTPATIENT_CLINIC_OR_DEPARTMENT_OTHER): Payer: Medicaid Other

## 2021-04-21 ENCOUNTER — Other Ambulatory Visit: Payer: Self-pay

## 2021-04-21 DIAGNOSIS — M5431 Sciatica, right side: Secondary | ICD-10-CM

## 2021-04-21 DIAGNOSIS — R202 Paresthesia of skin: Secondary | ICD-10-CM | POA: Insufficient documentation

## 2021-04-21 DIAGNOSIS — M545 Low back pain, unspecified: Secondary | ICD-10-CM | POA: Diagnosis present

## 2021-04-21 LAB — URINALYSIS, ROUTINE W REFLEX MICROSCOPIC
Bilirubin Urine: NEGATIVE
Glucose, UA: NEGATIVE mg/dL
Ketones, ur: NEGATIVE mg/dL
Leukocytes,Ua: NEGATIVE
Nitrite: NEGATIVE
Protein, ur: 30 mg/dL — AB
Specific Gravity, Urine: >=1.03 (ref 1.005–1.030)
pH: 5.5 (ref 5.0–8.0)

## 2021-04-21 LAB — PREGNANCY, URINE: Preg Test, Ur: NEGATIVE

## 2021-04-21 LAB — URINALYSIS, MICROSCOPIC (REFLEX): RBC / HPF: >50 RBC/hpf (ref 0–5)

## 2021-04-21 MED ORDER — NAPROXEN 375 MG PO TABS: 375.0000 mg | ORAL_TABLET | Freq: Two times a day (BID) | ORAL | 0 refills | Status: DC

## 2021-04-21 MED ORDER — METHOCARBAMOL 500 MG PO TABS: 500.0000 mg | ORAL_TABLET | Freq: Two times a day (BID) | ORAL | 0 refills | Status: DC

## 2021-04-21 MED ORDER — DEXAMETHASONE SODIUM PHOSPHATE 10 MG/ML IJ SOLN
10.0000 mg | Freq: Once | INTRAMUSCULAR | Status: AC
  Administered 2021-04-21: 10 mg via INTRAMUSCULAR
  Filled 2021-04-21: qty 1

## 2021-04-21 NOTE — Discharge Instructions (Addendum)
Please refer to the attached instructions 

## 2021-04-21 NOTE — ED Notes (Signed)
Pt transported to radiology.

## 2021-04-21 NOTE — ED Triage Notes (Signed)
Bilateral sciatica pain  ?H/o same  ?States pain in middle of lower back at this time ?Not taken anything for pain at this time ? ?Usually takes robaxin and naproxen but it currently out  ?

## 2021-04-21 NOTE — ED Provider Notes (Signed)
?[facility] ?Provider Note ? ? ?CSN: [id number] ?Arrival date & time: 04/21/21  1528 ? ?  ? ?History ? ?Chief Complaint  ?Patient presents with  ? Back Pain  ? ? ?Stephanie Clayton is a 36 y.o. female. ? ?Patient presents for evaluation of back pain. Previously treated for sciatica, today's episode similar to past. No recent injury to back, but does perform some lifting activities at work. Denies abdominal pain, N/V, urinary symptoms.  ? ?The history is provided by the patient. No language interpreter was used.  ?Back Pain ?Location:  Lumbar spine ?Ineffective treatments:  None tried ?Associated symptoms: leg pain and paresthesias   ?Associated symptoms: no abdominal pain, no bladder incontinence, no bowel incontinence, no dysuria, no fever, no numbness, no pelvic pain and no perianal numbness   ?Risk factors: not pregnant, no recent surgery and no steroid use   ? ?  ? ?Home Medications ?Prior to Admission medications   ?Medication Sig Start Date End Date Taking? Authorizing Provider  ?cephALEXin (KEFLEX) 500 MG capsule Take 1 capsule (500 mg total) by mouth 3 (three) times daily. 05/13/20   Horton, Barbette Hair, MD  ?HYDROcodone-acetaminophen (NORCO) 5-325 MG tablet Take 1 tablet by mouth every 4 (four) hours as needed for severe pain (for pain). 06/13/19   Molpus, John, MD  ?methocarbamol (ROBAXIN) 500 MG tablet Take 1 tablet (500 mg total) by mouth 2 (two) times daily. 07/10/20   Suzy Bouchard, PA-C  ?naproxen (NAPROSYN) 500 MG tablet Take 1 tablet (500 mg total) by mouth 2 (two) times daily. 05/13/20   Horton, Barbette Hair, MD  ?naproxen (NAPROSYN) 500 MG tablet Take 1 tablet (500 mg total) by mouth 2 (two) times daily. 07/10/20   Suzy Bouchard, PA-C  ?   ? ?Allergies    ?Patient has no known allergies.   ? ?Review of Systems   ?Review of Systems  ?Constitutional:  Negative for fever.  ?Gastrointestinal:  Negative for abdominal pain and bowel incontinence.  ?Genitourinary:   Negative for bladder incontinence, dysuria and pelvic pain.  ?Musculoskeletal:  Positive for back pain.  ?Neurological:  Positive for paresthesias. Negative for numbness.  ?All other systems reviewed and are negative. ? ?Physical Exam ?Updated Vital Signs ?BP (!) 127/96 (BP Location: Left Arm)   Pulse 69   Temp 98 ?F (36.7 ?C) (Oral)   Resp 18   Ht 5\' 4"  (1.626 m)   Wt 88.9 kg   LMP 04/21/2021 (Exact Date)   SpO2 97%   BMI 33.64 kg/m?  ?Physical Exam ?Vitals and nursing note reviewed.  ?Constitutional:   ?   Appearance: Normal appearance.  ?HENT:  ?   Head: Normocephalic.  ?   Nose: Nose normal.  ?   Mouth/Throat:  ?   Mouth: Mucous membranes are moist.  ?Eyes:  ?   Conjunctiva/sclera: Conjunctivae normal.  ?Cardiovascular:  ?   Rate and Rhythm: Normal rate.  ?Pulmonary:  ?   Effort: Pulmonary effort is normal.  ?Abdominal:  ?   Tenderness: There is no right CVA tenderness or left CVA tenderness.  ?Musculoskeletal:     ?   General: Normal range of motion.  ?   Cervical back: Normal range of motion and neck supple.  ?Skin: ?   General: Skin is warm and dry.  ?Neurological:  ?   General: No focal deficit present.  ?   Mental Status: She is alert and oriented to person, place, and time.  ?Psychiatric:     ?  Mood and Affect: Mood normal.     ?   Behavior: Behavior normal.  ? ? ?ED Results / Procedures / Treatments   ?Labs ?(all labs ordered are listed, but only abnormal results are displayed) ?Labs Reviewed  ?URINALYSIS, ROUTINE W REFLEX MICROSCOPIC - Abnormal; Notable for the following components:  ?    Result Value  ? Color, Urine ORANGE (*)   ? APPearance CLOUDY (*)   ? Hgb urine dipstick LARGE (*)   ? Protein, ur 30 (*)   ? All other components within normal limits  ?URINALYSIS, MICROSCOPIC (REFLEX) - Abnormal; Notable for the following components:  ? Bacteria, UA FEW (*)   ? All other components within normal limits  ?PREGNANCY, URINE  ? ? ?EKG ?None ? ?Radiology ?DG Lumbar Spine Complete ? ?Result Date:  04/21/2021 ?CLINICAL DATA:  lower back pain EXAM: LUMBAR SPINE - COMPLETE 4+ VIEW COMPARISON:  None. FINDINGS: Five non-rib-bearing lumbar vertebral bodies. There is no evidence of lumbar spine fracture. Alignment is normal. Intervertebral disc spaces are maintained. Right upper quadrant surgical clips. IMPRESSION: Negative. Electronically Signed   By: Iven Finn M.D.   On: 04/21/2021 16:43   ? ?Procedures ?Procedures  ? ? ?Medications Ordered in ED ?Medications  ?dexamethasone (DECADRON) injection 10 mg (10 mg Intramuscular Given 04/21/21 1653)  ? ? ?ED Course/ Medical Decision Making/ A&P ?  ?                        ?Medical Decision Making ?Amount and/or Complexity of Data Reviewed ?Labs: ordered. ?Radiology: ordered. ? ?Lab and radiology results reviewed. RBC, HGB and protein in urine likely associated with current menses. ? ? ?Patient with back pain.  No neurological deficits and normal neuro exam.  Patient is ambulatory.  No loss of bowel or bladder control.  No concern for cauda equina.  No fever, night sweats, weight loss, h/o cancer, IVDA, no recent procedure to back. No urinary symptoms suggestive of UTI.  Supportive care and return precaution discussed. Appears safe for discharge at this time. Follow up as indicated in discharge paperwork.   ? ? ? ? ? ? ? ?Final Clinical Impression(s) / ED Diagnoses ?Final diagnoses:  ?Sciatica of right side  ? ? ?Rx / DC Orders ?ED Discharge Orders   ? ?      Ordered  ?  naproxen (NAPROSYN) 375 MG tablet  2 times daily       ? 04/21/21 1649  ?  methocarbamol (ROBAXIN) 500 MG tablet  2 times daily       ? 04/21/21 1649  ? ?  ?  ? ?  ? ? ?  ?Etta Quill, NP ?04/21/21 1700 ? ?  ?Jeanell Sparrow, DO ?04/21/21 1808 ? ?

## 2021-07-09 ENCOUNTER — Encounter (HOSPITAL_BASED_OUTPATIENT_CLINIC_OR_DEPARTMENT_OTHER): Payer: Self-pay

## 2021-07-09 ENCOUNTER — Other Ambulatory Visit: Payer: Self-pay

## 2021-07-09 ENCOUNTER — Emergency Department (HOSPITAL_BASED_OUTPATIENT_CLINIC_OR_DEPARTMENT_OTHER)
Admission: EM | Admit: 2021-07-09 | Discharge: 2021-07-10 | Disposition: A | Discharge status: Home / Self Care | Payer: Medicaid Other | Attending: Emergency Medicine | Admitting: Emergency Medicine

## 2021-07-09 DIAGNOSIS — Z87891 Personal history of nicotine dependence: Secondary | ICD-10-CM | POA: Diagnosis not present

## 2021-07-09 DIAGNOSIS — G8929 Other chronic pain: Secondary | ICD-10-CM

## 2021-07-09 DIAGNOSIS — M5441 Lumbago with sciatica, right side: Secondary | ICD-10-CM | POA: Insufficient documentation

## 2021-07-09 DIAGNOSIS — M79604 Pain in right leg: Secondary | ICD-10-CM | POA: Diagnosis present

## 2021-07-09 NOTE — ED Notes (Signed)
Called to take to treatment room  No response from lobby 

## 2021-07-09 NOTE — ED Triage Notes (Signed)
Pt c/o "sciatic nerve" pain to the R leg that started today.

## 2021-07-10 MED ORDER — ACETAMINOPHEN 325 MG PO TABS: 650.0000 mg | ORAL_TABLET | Freq: Four times a day (QID) | ORAL | 0 refills | Status: AC | PRN

## 2021-07-10 MED ORDER — METHOCARBAMOL 500 MG PO TABS: 1000.0000 mg | ORAL_TABLET | Freq: Two times a day (BID) | ORAL | 0 refills | Status: AC

## 2021-07-10 MED ORDER — KETOROLAC TROMETHAMINE 60 MG/2ML IM SOLN
60.0000 mg | Freq: Once | INTRAMUSCULAR | Status: AC
  Administered 2021-07-10: 60 mg via INTRAMUSCULAR
  Filled 2021-07-10: qty 2

## 2021-07-10 MED ORDER — LIDOCAINE 5 % EX PTCH: 1.0000 | MEDICATED_PATCH | Freq: Every day | CUTANEOUS | 0 refills | Status: DC | PRN

## 2021-07-10 MED ORDER — LIDOCAINE 5 % EX PTCH
1.0000 | MEDICATED_PATCH | CUTANEOUS | Status: DC
  Administered 2021-07-10: 1 via TRANSDERMAL
  Filled 2021-07-10: qty 1

## 2021-07-10 MED ORDER — HYDROCODONE-ACETAMINOPHEN 5-325 MG PO TABS
1.0000 | ORAL_TABLET | Freq: Once | ORAL | Status: AC
  Administered 2021-07-10: 1 via ORAL
  Filled 2021-07-10: qty 1

## 2021-07-10 MED ORDER — IBUPROFEN 600 MG PO TABS: 600.0000 mg | ORAL_TABLET | Freq: Four times a day (QID) | ORAL | 0 refills | Status: AC | PRN

## 2021-07-10 NOTE — Discharge Instructions (Signed)
Please follow-up with your Primary Care Physician within the next week. Please take your medications as instructed and discuss any changes to your medications with your primary care physician.    Please return to the Emergency Department if you have any leg numbness, leg weakness, difficulty walking, fevers, worsening of pain, lightheadedness, lose consciousness, severe abdominal pain, severe headache, difficulty urinating, or difficulty having a bowel movement.   Please return to the emergency department immediately for any new or concerning symptoms, or if you get worse.   

## 2021-07-10 NOTE — ED Provider Notes (Signed)
[facility] Provider Note   CSN: [id number] Arrival date & time: 07/09/21  2017     History  Chief Complaint  Patient presents with   Leg Pain    Stephanie Clayton is a 36 y.o. female.   Patient as above with significant medical history as below, including sciatic leg pain, prediabetes who presents to the ED with complaint of recurrence of her sciatic leg pain.  Patient feels pain is more so on the right than the left.  Feels similar to episodes of sciatica in the past.  Onset of symptoms 24-36 hrs. ago.  She took Naprosyn x1 history without significant relief of her discomfort.  No fevers or chills.  No IV drug use.  No saddle paresthesias, no urinary overflow or incontinence.  No bowel incontinence.  She has an intermittent tingling to her feet bilateral which is unchanged from prior.  No recent falls or injuries reported.  She is ambulatory     Past Medical History: No date: Prediabetes No date: Sciatic leg pain  Past Surgical History: No date: CESAREAN SECTION No date: CHOLECYSTECTOMY No date: TUBAL LIGATION    The history is provided by the patient. No language interpreter was used.  Leg Pain Associated symptoms: back pain   Associated symptoms: no fever       Home Medications Prior to Admission medications   Medication Sig Start Date End Date Taking? Authorizing Provider  acetaminophen (TYLENOL) 325 MG tablet Take 2 tablets (650 mg total) by mouth every 6 (six) hours as needed. 07/10/21  Yes Wynona Dove A, DO  ibuprofen (ADVIL) 600 MG tablet Take 1 tablet (600 mg total) by mouth every 6 (six) hours as needed. 07/10/21  Yes Wynona Dove A, DO  lidocaine (LIDODERM) 5 % Place 1 patch onto the skin daily as needed. Remove & Discard patch within 12 hours or as directed by MD 07/10/21  Yes Jeanell Sparrow, DO  methocarbamol (ROBAXIN) 500 MG tablet Take 2 tablets (1,000 mg total) by mouth 2 (two) times daily for 5 days. 07/10/21 07/15/21 Yes  Jeanell Sparrow, DO  cephALEXin (KEFLEX) 500 MG capsule Take 1 capsule (500 mg total) by mouth 3 (three) times daily. 05/13/20   Horton, Barbette Hair, MD  HYDROcodone-acetaminophen (NORCO) 5-325 MG tablet Take 1 tablet by mouth every 4 (four) hours as needed for severe pain (for pain). 06/13/19   Molpus, John, MD  methocarbamol (ROBAXIN) 500 MG tablet Take 1 tablet (500 mg total) by mouth 2 (two) times daily. 07/10/20   Suzy Bouchard, PA-C  methocarbamol (ROBAXIN) 500 MG tablet Take 1 tablet (500 mg total) by mouth 2 (two) times daily. 04/21/21   Etta Quill, NP  naproxen (NAPROSYN) 375 MG tablet Take 1 tablet (375 mg total) by mouth 2 (two) times daily. 04/21/21   Etta Quill, NP  naproxen (NAPROSYN) 500 MG tablet Take 1 tablet (500 mg total) by mouth 2 (two) times daily. 05/13/20   Horton, Barbette Hair, MD  naproxen (NAPROSYN) 500 MG tablet Take 1 tablet (500 mg total) by mouth 2 (two) times daily. 07/10/20   Suzy Bouchard, PA-C      Allergies    Patient has no known allergies.    Review of Systems   Review of Systems  Constitutional:  Negative for chills and fever.  HENT:  Negative for facial swelling and trouble swallowing.   Eyes:  Negative for photophobia and visual disturbance.  Respiratory:  Negative for cough and shortness of  breath.   Cardiovascular:  Negative for chest pain and palpitations.  Gastrointestinal:  Negative for abdominal pain, nausea and vomiting.  Endocrine: Negative for polydipsia and polyuria.  Genitourinary:  Negative for difficulty urinating and hematuria.  Musculoskeletal:  Positive for back pain. Negative for gait problem and joint swelling.  Skin:  Negative for pallor and rash.  Neurological:  Negative for syncope and headaches.  Psychiatric/Behavioral:  Negative for agitation and confusion.    Physical Exam Updated Vital Signs BP (!) 138/92 (BP Location: Left Arm)   Pulse 73   Temp 98.7 F (37.1 C) (Oral)   Resp 18   Ht 5\' 4"  (1.626 m)   Wt 90.7 kg    LMP 06/15/2021   SpO2 98%   BMI 34.33 kg/m  Physical Exam Vitals and nursing note reviewed.  Constitutional:      General: She is not in acute distress.    Appearance: Normal appearance. She is obese. She is not diaphoretic.  HENT:     Head: Normocephalic and atraumatic.     Right Ear: External ear normal.     Left Ear: External ear normal.     Nose: Nose normal.     Mouth/Throat:     Mouth: Mucous membranes are moist.  Eyes:     General: No scleral icterus.       Right eye: No discharge.        Left eye: No discharge.  Cardiovascular:     Rate and Rhythm: Normal rate and regular rhythm.     Pulses: Normal pulses.     Heart sounds: Normal heart sounds.  Pulmonary:     Effort: Pulmonary effort is normal. No respiratory distress.     Breath sounds: Normal breath sounds.  Abdominal:     General: Abdomen is flat.     Tenderness: There is no abdominal tenderness.  Musculoskeletal:        General: Normal range of motion.       Arms:     Cervical back: Normal range of motion.     Right lower leg: No edema.     Left lower leg: No edema.     Comments: No midline spinous process tenderness to palpation or percussion, no crepitus or step-off.  Rectal tone is intact.  Paraspinal tenderness low lumbar bilateral  Skin:    General: Skin is warm and dry.     Capillary Refill: Capillary refill takes less than 2 seconds.  Neurological:     Mental Status: She is alert and oriented to person, place, and time.     GCS: GCS eye subscore is 4. GCS verbal subscore is 5. GCS motor subscore is 6.     Cranial Nerves: Cranial nerves 2-12 are intact. No cranial nerve deficit.     Sensory: Sensation is intact.     Motor: Motor function is intact.     Coordination: Coordination is intact.  Psychiatric:        Mood and Affect: Mood normal.        Behavior: Behavior normal.    ED Results / Procedures / Treatments   Labs (all labs ordered are listed, but only abnormal results are  displayed) Labs Reviewed - No data to display  EKG None  Radiology No results found.  Procedures Procedures    Medications Ordered in ED Medications  lidocaine (LIDODERM) 5 % 1 patch (1 patch Transdermal Patch Applied 07/10/21 0043)  ketorolac (TORADOL) injection 60 mg (60 mg Intramuscular Given 07/10/21 0039)  HYDROcodone-acetaminophen (NORCO/VICODIN) 5-325 MG per tablet 1 tablet (1 tablet Oral Given 07/10/21 0043)    ED Course/ Medical Decision Making/ A&P                           Medical Decision Making Risk OTC drugs. Prescription drug management.    CC: Low back pain  This patient presents to the Emergency Department for the above complaint. This involves an extensive number of treatment options and is a complaint that carries with it a high risk of complications and morbidity. Vital signs were reviewed. Serious etiologies considered.  DDx includes but not limited to MSK, sprain, strain, sciatica, equina, cord compression, epidural abscess other acute pathologies  Record review:  Previous records obtained and reviewed  Prior ED visits, prior labs and imaging. XR from prev visit viewed.   Additional history obtained from spouse  Medical and surgical history as noted above.   Work up as above, notable for:   Labs & imaging results that were available during my care of the patient were visualized by me and considered in my medical decision making.   Management: Give patient Toradol, Norco, lidocaine patch.  Discussed Toradol ministration with the patient.  Recommended a pregnancy test prior to Toradol, patient prefers to skip the pregnancy test, she does not believe that she is pregnant.  Reassessment:  She is feeling much better, ambulatory w/ steady gait. Advised her to f/u with PCP. Discussed home medication regimen. Back exercises, avoiding heavy lifting.  Patient presents with low back pain without signs of spinal cord compression, cauda equina syndrome,  infection, aneurysm, or other serious etiology. The patient is neurologically intact. Given the extremely low risk of these diagnoses further testing and evaluation for these possibilities does not appear to be indicated at this time. Detailed discussions were had with the patient and/or family and caregivers, regarding current findings, and need for close f/u with PCP or on call doctor. The patient has been instructed to return immediately if the symptoms worsen in any way. Patient verbalized understanding and is in agreement with current care plan. All questions answered prior to discharge.                Social determinants of health include -  Social History   Socioeconomic History   Marital status: Divorced    Spouse name: Not on file   Number of children: Not on file   Years of education: Not on file   Highest education level: Not on file  Occupational History   Not on file  Tobacco Use   Smoking status: Former    Packs/day: 0.50    Types: Cigarettes   Smokeless tobacco: Never  Vaping Use   Vaping Use: Former   Quit date: 02/23/2021  Substance and Sexual Activity   Alcohol use: Not Currently    Comment: occ   Drug use: Yes    Types: Marijuana   Sexual activity: Not on file  Other Topics Concern   Not on file  Social History Narrative   Not on file   Social Determinants of Health   Financial Resource Strain: Not on file  Food Insecurity: Not on file  Transportation Needs: Not on file  Physical Activity: Not on file  Stress: Not on file  Social Connections: Not on file  Intimate Partner Violence: Not on file      This chart was dictated using voice recognition software.  Despite best efforts to  proofread,  errors can occur which can change the documentation meaning.         Final Clinical Impression(s) / ED Diagnoses Final diagnoses:  Chronic low back pain with right-sided sciatica, unspecified back pain laterality    Rx / DC Orders ED  Discharge Orders          Ordered    methocarbamol (ROBAXIN) 500 MG tablet  2 times daily        07/10/21 0128    ibuprofen (ADVIL) 600 MG tablet  Every 6 hours PRN        07/10/21 0128    acetaminophen (TYLENOL) 325 MG tablet  Every 6 hours PRN        07/10/21 0128    lidocaine (LIDODERM) 5 %  Daily PRN        07/10/21 0128              Jeanell Sparrow, DO 07/10/21 0129

## 2021-10-21 ENCOUNTER — Other Ambulatory Visit: Payer: Self-pay

## 2021-10-21 ENCOUNTER — Emergency Department (HOSPITAL_BASED_OUTPATIENT_CLINIC_OR_DEPARTMENT_OTHER)
Admission: EM | Admit: 2021-10-21 | Discharge: 2021-10-21 | Disposition: A | Discharge status: Home / Self Care | Payer: Medicaid Other | Attending: Emergency Medicine | Admitting: Emergency Medicine

## 2021-10-21 ENCOUNTER — Encounter (HOSPITAL_BASED_OUTPATIENT_CLINIC_OR_DEPARTMENT_OTHER): Payer: Self-pay

## 2021-10-21 DIAGNOSIS — M5431 Sciatica, right side: Secondary | ICD-10-CM | POA: Diagnosis not present

## 2021-10-21 DIAGNOSIS — R350 Frequency of micturition: Secondary | ICD-10-CM | POA: Insufficient documentation

## 2021-10-21 DIAGNOSIS — M545 Low back pain, unspecified: Secondary | ICD-10-CM | POA: Diagnosis present

## 2021-10-21 LAB — URINALYSIS, ROUTINE W REFLEX MICROSCOPIC
Bilirubin Urine: NEGATIVE
Glucose, UA: NEGATIVE mg/dL
Hgb urine dipstick: NEGATIVE
Ketones, ur: NEGATIVE mg/dL
Leukocytes,Ua: NEGATIVE
Nitrite: NEGATIVE
Protein, ur: NEGATIVE mg/dL
Specific Gravity, Urine: 1.015 (ref 1.005–1.030)
pH: 7 (ref 5.0–8.0)

## 2021-10-21 LAB — PREGNANCY, URINE: Preg Test, Ur: NEGATIVE

## 2021-10-21 MED ORDER — KETOROLAC TROMETHAMINE 60 MG/2ML IM SOLN
60.0000 mg | Freq: Once | INTRAMUSCULAR | Status: AC
  Administered 2021-10-21: 60 mg via INTRAMUSCULAR
  Filled 2021-10-21: qty 2

## 2021-10-21 MED ORDER — METHYLPREDNISOLONE 4 MG PO TBPK: ORAL_TABLET | ORAL | 0 refills | Status: AC

## 2021-10-21 MED ORDER — DEXAMETHASONE SODIUM PHOSPHATE 10 MG/ML IJ SOLN
10.0000 mg | Freq: Once | INTRAMUSCULAR | Status: AC
  Administered 2021-10-21: 10 mg via INTRAMUSCULAR
  Filled 2021-10-21: qty 1

## 2021-10-21 MED ORDER — METHOCARBAMOL 750 MG PO TABS: 750.0000 mg | ORAL_TABLET | Freq: Four times a day (QID) | ORAL | 0 refills | Status: DC

## 2021-10-21 NOTE — ED Provider Notes (Signed)
[facility]PARTMENT Provider Note   CSN: [id number] Arrival date & time: 10/21/21  0745     History  Chief Complaint  Patient presents with   Leg Pain   Possible Pregnancy    Stephanie Clayton is a 36 y.o. female.  HPI Patient reports that she has had problems with low back pain and sciatica for a number of years.  She reports that it periodically flares up.  She reports over the past several days she has had increased pain going down her hip and about to the knee.  She does not have pain or numbness or weakness to the lower leg or foot.  Made much worse by certain bending and twisting motions.  Patient has been able to ambulate but is uncomfortable.  No pain burning with urination.  Patient reports she feels like she is urinating more frequently over the past couple of weeks.  Patient reports she might be pregnant.  She reports she missed her last menstrual cycle.  No significant abdominal pain or vaginal discharge.  No fevers no chills    Home Medications Prior to Admission medications   Medication Sig Start Date End Date Taking? Authorizing Provider  methocarbamol (ROBAXIN-750) 750 MG tablet Take 1 tablet (750 mg total) by mouth 4 (four) times daily. 10/21/21  Yes Arby Barrette, MD  methylPREDNISolone (MEDROL DOSEPAK) 4 MG TBPK tablet Per dose pack to start 10/23/2021 10/21/21  Yes Jaceyon Strole, Lebron Conners, MD  acetaminophen (TYLENOL) 325 MG tablet Take 2 tablets (650 mg total) by mouth every 6 (six) hours as needed. 07/10/21   Sloan Leiter, DO  cephALEXin (KEFLEX) 500 MG capsule Take 1 capsule (500 mg total) by mouth 3 (three) times daily. 05/13/20   Horton, Mayer Masker, MD  HYDROcodone-acetaminophen (NORCO) 5-325 MG tablet Take 1 tablet by mouth every 4 (four) hours as needed for severe pain (for pain). 06/13/19   Molpus, John, MD  ibuprofen (ADVIL) 600 MG tablet Take 1 tablet (600 mg total) by mouth every 6 (six) hours as needed. 07/10/21   Tanda Rockers A, DO  lidocaine  (LIDODERM) 5 % Place 1 patch onto the skin daily as needed. Remove & Discard patch within 12 hours or as directed by MD 07/10/21   Sloan Leiter, DO  methocarbamol (ROBAXIN) 500 MG tablet Take 1 tablet (500 mg total) by mouth 2 (two) times daily. 07/10/20   Mannie Stabile, PA-C  methocarbamol (ROBAXIN) 500 MG tablet Take 1 tablet (500 mg total) by mouth 2 (two) times daily. 04/21/21   Felicie Morn, NP  naproxen (NAPROSYN) 375 MG tablet Take 1 tablet (375 mg total) by mouth 2 (two) times daily. 04/21/21   Felicie Morn, NP  naproxen (NAPROSYN) 500 MG tablet Take 1 tablet (500 mg total) by mouth 2 (two) times daily. 05/13/20   Horton, Mayer Masker, MD  naproxen (NAPROSYN) 500 MG tablet Take 1 tablet (500 mg total) by mouth 2 (two) times daily. 07/10/20   Mannie Stabile, PA-C      Allergies    Patient has no known allergies.    Review of Systems   Review of Systems 10 systems reviewed negative except as per HPI Physical Exam Updated Vital Signs BP 122/89   Pulse 66   Temp 98 F (36.7 C) (Oral)   Resp 18   Ht 5\' 4"  (1.626 m)   Wt 83.5 kg   LMP 09/04/2021   SpO2 100%   BMI 31.58 kg/m  Physical Exam Constitutional:  Comments: Alert nontoxic clear mental status.  Patient is lying on her left side.   HENT:     Head: Normocephalic and atraumatic.     Mouth/Throat:     Pharynx: Oropharynx is clear.  Eyes:     Extraocular Movements: Extraocular movements intact.  Cardiovascular:     Rate and Rhythm: Normal rate and regular rhythm.  Pulmonary:     Effort: Pulmonary effort is normal.     Breath sounds: Normal breath sounds.  Abdominal:     General: There is no distension.     Palpations: Abdomen is soft.     Tenderness: There is no abdominal tenderness. There is no guarding.  Musculoskeletal:     Comments: No midline spinal tenderness.  Patient endorses some discomfort over the right SI joint region.  Right leg raise exacerbates pain in the low back and hip.  Bilateral lower  extremity symmetric without peripheral edema.  Calves are soft nontender.  Distal pulses 2+ symmetric.  Feet are warm and dry.  Skin:    General: Skin is warm and dry.  Neurological:     Comments: Alert and oriented x3.  Speech is clear.  Movements coordinated and purposeful.  Lower extremities intact plantar and dorsiflexion against resistance.  Patient can spontaneously raise and lower each leg independently.  Psychiatric:        Mood and Affect: Mood normal.     ED Results / Procedures / Treatments   Labs (all labs ordered are listed, but only abnormal results are displayed) Labs Reviewed  PREGNANCY, URINE  URINALYSIS, ROUTINE W REFLEX MICROSCOPIC    EKG None  Radiology No results found.  Procedures Procedures    Medications Ordered in ED Medications  ketorolac (TORADOL) injection 60 mg (60 mg Intramuscular Given 10/21/21 0859)  dexamethasone (DECADRON) injection 10 mg (10 mg Intramuscular Given 10/21/21 0859)    ED Course/ Medical Decision Making/ A&P                           Medical Decision Making Amount and/or Complexity of Data Reviewed Labs: ordered.  Risk Prescription drug management.  Patient reports history of low back pain with sciatica periodically.  Patient reports urinary frequency but no urinary retention.  Patient reports a missed menstrual cycle is unsure if she is pregnant.  No neurologic deficits present on physical exam.  At this time we will proceed with urine pregnancy and urinalysis.  Based on patient's history of pre-existing sciatica and no neurologic deficit do not feel that additional imaging is indicated at this time.  Patient treated with Decadron and Toradol for pain control.  Urinalysis negative and pregnancy test negative.  Upon reassessment patient is up and ambulating about the room without difficulty.  She is bending over to adjust clothing.  She has good range of motion and good strength.  Stable for discharge with plan for Medrol  Dosepak and Robaxin.  Return precautions and follow-up plan reviewed and included in discharge instructions.         Final Clinical Impression(s) / ED Diagnoses Final diagnoses:  Sciatica of right side  Urinary frequency    Rx / DC Orders ED Discharge Orders          Ordered    methylPREDNISolone (MEDROL DOSEPAK) 4 MG TBPK tablet        10/21/21 0925    methocarbamol (ROBAXIN-750) 750 MG tablet  4 times daily  10/21/21 7673              Arby Barrette, MD 10/23/21 [phone]

## 2021-10-21 NOTE — Discharge Instructions (Signed)
1.  You were given a dose of Decadron in the shot in the emergency department.  This is a steroid that we will be working for the next several days.  You have also been given a prescription for steroid to take by mouth.  This is the Medrol Dosepak.  Start the Dosepak per instructions on 9\2\2023. 2.  You have also been given a prescription for Robaxin.  This is a muscle relaxer.  You may take it as needed per prescription instructions.  If you need additional pain control, you may also take extra strength Tylenol every 6 hours per package instructions.  May also use over-the-counter Lidoderm pain patches. 3.  Follow-up with your doctor for recheck within the next 5 to 10 days. 4.  Review instructions for sciatica and return to the emergency department if you have new worsening concerning symptoms.

## 2021-10-21 NOTE — ED Triage Notes (Addendum)
Right leg pain with history of sciatica. Patient states she is possibly pregnant. Lmp was 7/15.  0905: Patient was able to ambulate to and from restroom without incident.

## 2022-06-26 ENCOUNTER — Encounter (HOSPITAL_BASED_OUTPATIENT_CLINIC_OR_DEPARTMENT_OTHER): Payer: Self-pay | Admitting: Emergency Medicine

## 2022-06-26 ENCOUNTER — Other Ambulatory Visit: Payer: Self-pay

## 2022-06-26 DIAGNOSIS — I83811 Varicose veins of right lower extremities with pain: Secondary | ICD-10-CM | POA: Insufficient documentation

## 2022-06-26 DIAGNOSIS — Z5321 Procedure and treatment not carried out due to patient leaving prior to being seen by health care provider: Secondary | ICD-10-CM | POA: Insufficient documentation

## 2022-06-26 NOTE — ED Triage Notes (Signed)
Right sided siactic nerve pain x 5 days. Also c/o varicose vein on right lateral thigh.

## 2022-06-27 ENCOUNTER — Emergency Department (HOSPITAL_BASED_OUTPATIENT_CLINIC_OR_DEPARTMENT_OTHER)
Admission: EM | Admit: 2022-06-27 | Discharge: 2022-06-27 | Discharge status: Against Medical Advice | Payer: Medicaid Other | Attending: Emergency Medicine | Admitting: Emergency Medicine

## 2022-07-04 ENCOUNTER — Other Ambulatory Visit: Payer: Self-pay

## 2022-07-04 ENCOUNTER — Encounter (HOSPITAL_BASED_OUTPATIENT_CLINIC_OR_DEPARTMENT_OTHER): Payer: Self-pay | Admitting: Urology

## 2022-07-04 ENCOUNTER — Emergency Department (HOSPITAL_BASED_OUTPATIENT_CLINIC_OR_DEPARTMENT_OTHER)
Admission: EM | Admit: 2022-07-04 | Discharge: 2022-07-04 | Disposition: A | Discharge status: Home / Self Care | Payer: Medicaid Other | Attending: Emergency Medicine | Admitting: Emergency Medicine

## 2022-07-04 ENCOUNTER — Emergency Department (HOSPITAL_BASED_OUTPATIENT_CLINIC_OR_DEPARTMENT_OTHER): Payer: Medicaid Other

## 2022-07-04 DIAGNOSIS — M5441 Lumbago with sciatica, right side: Secondary | ICD-10-CM | POA: Diagnosis not present

## 2022-07-04 DIAGNOSIS — G8929 Other chronic pain: Secondary | ICD-10-CM

## 2022-07-04 DIAGNOSIS — M549 Dorsalgia, unspecified: Secondary | ICD-10-CM | POA: Diagnosis present

## 2022-07-04 LAB — LIPASE, BLOOD: Lipase: 38 U/L (ref 11–51)

## 2022-07-04 LAB — CBC WITH DIFFERENTIAL/PLATELET
Abs Immature Granulocytes: 0.01 10*3/uL (ref 0.00–0.07)
Basophils Absolute: 0 10*3/uL (ref 0.0–0.1)
Basophils Relative: 0 %
Eosinophils Absolute: 0.2 10*3/uL (ref 0.0–0.5)
Eosinophils Relative: 3 %
HCT: 36 % (ref 36.0–46.0)
Hemoglobin: 11.5 g/dL — ABNORMAL LOW (ref 12.0–15.0)
Immature Granulocytes: 0 %
Lymphocytes Relative: 29 %
Lymphs Abs: 2.3 10*3/uL (ref 0.7–4.0)
MCH: 25.8 pg — ABNORMAL LOW (ref 26.0–34.0)
MCHC: 31.9 g/dL (ref 30.0–36.0)
MCV: 80.9 fL (ref 80.0–100.0)
Monocytes Absolute: 0.6 10*3/uL (ref 0.1–1.0)
Monocytes Relative: 8 %
Neutro Abs: 4.7 10*3/uL (ref 1.7–7.7)
Neutrophils Relative %: 60 %
Platelets: 419 10*3/uL — ABNORMAL HIGH (ref 150–400)
RBC: 4.45 MIL/uL (ref 3.87–5.11)
RDW: 16.6 % — ABNORMAL HIGH (ref 11.5–15.5)
WBC: 7.9 10*3/uL (ref 4.0–10.5)
nRBC: 0 % (ref 0.0–0.2)

## 2022-07-04 LAB — COMPREHENSIVE METABOLIC PANEL
ALT: 15 U/L (ref 0–44)
AST: 21 U/L (ref 15–41)
Albumin: 3.8 g/dL (ref 3.5–5.0)
Alkaline Phosphatase: 73 U/L (ref 38–126)
Anion gap: 8 (ref 5–15)
BUN: 17 mg/dL (ref 6–20)
CO2: 27 mmol/L (ref 22–32)
Calcium: 9 mg/dL (ref 8.9–10.3)
Chloride: 103 mmol/L (ref 98–111)
Creatinine, Ser: 0.73 mg/dL (ref 0.44–1.00)
GFR, Estimated: >60 mL/min (ref >60)
Glucose, Bld: 90 mg/dL (ref 70–99)
Potassium: 3.7 mmol/L (ref 3.5–5.1)
Sodium: 138 mmol/L (ref 135–145)
Total Bilirubin: 0.4 mg/dL (ref 0.3–1.2)
Total Protein: 8.4 g/dL — ABNORMAL HIGH (ref 6.5–8.1)

## 2022-07-04 LAB — URINALYSIS, ROUTINE W REFLEX MICROSCOPIC
Bilirubin Urine: NEGATIVE
Glucose, UA: NEGATIVE mg/dL
Hgb urine dipstick: NEGATIVE
Ketones, ur: NEGATIVE mg/dL
Leukocytes,Ua: NEGATIVE
Nitrite: NEGATIVE
Protein, ur: NEGATIVE mg/dL
Specific Gravity, Urine: 1.025 (ref 1.005–1.030)
pH: 5.5 (ref 5.0–8.0)

## 2022-07-04 LAB — D-DIMER, QUANTITATIVE: D-Dimer, Quant: 0.28 ug/mL-FEU (ref 0.00–0.50)

## 2022-07-04 LAB — TROPONIN I (HIGH SENSITIVITY): Troponin I (High Sensitivity): 2 ng/L (ref <18)

## 2022-07-04 LAB — HCG, SERUM, QUALITATIVE: Preg, Serum: NEGATIVE

## 2022-07-04 MED ORDER — ONDANSETRON HCL 4 MG/2ML IJ SOLN
4.0000 mg | Freq: Once | INTRAMUSCULAR | Status: AC
  Administered 2022-07-04: 4 mg via INTRAVENOUS
  Filled 2022-07-04: qty 2

## 2022-07-04 MED ORDER — METHOCARBAMOL 500 MG PO TABS
500.0000 mg | ORAL_TABLET | Freq: Once | ORAL | Status: AC
  Administered 2022-07-04: 500 mg via ORAL
  Filled 2022-07-04: qty 1

## 2022-07-04 MED ORDER — LIDOCAINE 5 % EX PTCH: 1.0000 | MEDICATED_PATCH | CUTANEOUS | 0 refills | Status: DC

## 2022-07-04 MED ORDER — DEXAMETHASONE SODIUM PHOSPHATE 10 MG/ML IJ SOLN
10.0000 mg | Freq: Once | INTRAMUSCULAR | Status: AC
  Administered 2022-07-04: 10 mg via INTRAVENOUS
  Filled 2022-07-04: qty 1

## 2022-07-04 MED ORDER — PREDNISONE 10 MG (21) PO TBPK: ORAL_TABLET | Freq: Every day | ORAL | 0 refills | Status: DC

## 2022-07-04 MED ORDER — KETOROLAC TROMETHAMINE 30 MG/ML IJ SOLN
30.0000 mg | Freq: Once | INTRAMUSCULAR | Status: AC
  Administered 2022-07-04: 30 mg via INTRAVENOUS
  Filled 2022-07-04: qty 1

## 2022-07-04 MED ORDER — MORPHINE SULFATE (PF) 4 MG/ML IV SOLN: 4.0000 mg | Freq: Once | INTRAVENOUS | Status: DC

## 2022-07-04 MED ORDER — METHOCARBAMOL 500 MG PO TABS: 500.0000 mg | ORAL_TABLET | Freq: Two times a day (BID) | ORAL | 0 refills | Status: DC

## 2022-07-04 NOTE — ED Notes (Signed)
Pt calling for ride home since I will be giving her morphine

## 2022-07-04 NOTE — Discharge Instructions (Signed)
It was a pleasure taking care of you here in the emergency department  Take the medications as prescribed  Return for new or worsening symptoms

## 2022-07-04 NOTE — ED Triage Notes (Signed)
Pt states sciatic nerve pain since last seen on 5/6 Pain radiating from lower back to bilateral legs now  States started on gabapentin and now is having some shortness of breath

## 2022-07-04 NOTE — ED Notes (Signed)
Does not have ride home dr aware, pt up walking to xray without difficulty

## 2022-07-04 NOTE — ED Provider Notes (Signed)
[place] [facility] AT [facility] Provider Note   CSN: [id number] Arrival date & time: 07/04/22  1559    History  Chief Complaint  Patient presents with   Sciatic Pain     Deanine Vanderburgh is a 37 y.o. female here for evaluation of back pain into right leg.  Has history of chronic back pain.  States pain is so bad it hurts to "take a deep breath" because of the pain however denies any overt chest pain.  No swelling to legs.  No history of IV drug use, bowel or bladder incontinence, saddle paresthesia.  Was seen by PCP few days ago started gabapentin and referred to orthopedics however patient does not like the way gabapentin makes her feel.  No exertional chest pain.  No redness, warmth, numbness to extremities.  HPI     Home Medications Prior to Admission medications   Medication Sig Start Date End Date Taking? Authorizing Provider  lidocaine (LIDODERM) 5 % Place 1 patch onto the skin daily. Remove & Discard patch within 12 hours or as directed by MD 07/04/22  Yes Maye Parkinson A, PA-C  methocarbamol (ROBAXIN) 500 MG tablet Take 1 tablet (500 mg total) by mouth 2 (two) times daily. 07/04/22  Yes Makhi Muzquiz A, PA-C  predniSONE (STERAPRED UNI-PAK 21 TAB) 10 MG (21) TBPK tablet Take by mouth daily. Take 6 tabs by mouth daily  for 1 day, then 5 tabs for 1 days, then 4 tabs for 1 days, then 3 tabs for 1 days, 2 tabs for 1 days, then 1 tab by mouth daily for 1 days 07/04/22  Yes Marisabel Macpherson A, PA-C  acetaminophen (TYLENOL) 325 MG tablet Take 2 tablets (650 mg total) by mouth every 6 (six) hours as needed. 07/10/21   Sloan Leiter, DO  cephALEXin (KEFLEX) 500 MG capsule Take 1 capsule (500 mg total) by mouth 3 (three) times daily. 05/13/20   Horton, Mayer Masker, MD  HYDROcodone-acetaminophen (NORCO) 5-325 MG tablet Take 1 tablet by mouth every 4 (four) hours as needed for severe pain (for pain). 06/13/19   Molpus, John, MD  ibuprofen (ADVIL) 600 MG tablet Take  1 tablet (600 mg total) by mouth every 6 (six) hours as needed. 07/10/21   Sloan Leiter, DO  methylPREDNISolone (MEDROL DOSEPAK) 4 MG TBPK tablet Per dose pack to start 10/23/2021 10/21/21   Arby Barrette, MD      Allergies    Patient has no known allergies.    Review of Systems   Review of Systems  Constitutional: Negative.   HENT: Negative.    Respiratory: Negative.    Cardiovascular: Negative.   Gastrointestinal: Negative.   Genitourinary: Negative.   Musculoskeletal:  Positive for back pain. Negative for arthralgias, gait problem, joint swelling, myalgias, neck pain and neck stiffness.  Skin: Negative.   Neurological: Negative.   All other systems reviewed and are negative.   Physical Exam Updated Vital Signs BP 127/80 (BP Location: Right Arm)   Pulse 85   Temp 97.8 F (36.6 C)   Resp 20   Ht 5\' 4"  (1.626 m)   Wt 84.4 kg   LMP 06/16/2022 (Exact Date)   SpO2 96%   BMI 31.93 kg/m  Physical Exam Physical Exam  Constitutional: Pt appears well-developed and well-nourished. No distress.  HENT:  Head: Normocephalic and atraumatic.  Neck: Normal range of motion. Neck supple.  Full ROM without pain  Cardiovascular: Normal rate, regular rhythm and intact distal pulses.  Pulmonary/Chest: Effort normal and breath sounds normal. No respiratory distress. Pt has no wheezes.  Abdominal: Soft. Pt exhibits no distension. There is no tenderness, rebound or guarding. No abd bruit or pulsatile mass Musculoskeletal:  Full range of motion of the T-spine and L-spine with flexion, hyperextension, and lateral flexion. No midline tenderness or stepoffs. No tenderness to palpation of the spinous processes of the T-spine or L-spine. Mild tenderness to palpation of the paraspinous muscles of the L-spine.  Nontender bilateral upper and lower extremities, compartment soft, full range of motion Lymphadenopathy:    Pt has no cervical adenopathy.  Neurological: Pt is alert. Pt has normal  reflexes.  Speech is clear and goal oriented, follows commands Equal strength Sensation normal to light and sharp touch Moves extremities without ataxia, coordination intact Normal gait Normal balance Skin: Skin is warm and dry. No rash noted or lesions noted. Pt is not diaphoretic. No erythema, ecchymosis,edema or warmth.  Psychiatric: Pt has a normal mood and affect. Behavior is normal.  Nursing note and vitals reviewed.  ED Results / Procedures / Treatments   Labs (all labs ordered are listed, but only abnormal results are displayed) Labs Reviewed  CBC WITH DIFFERENTIAL/PLATELET - Abnormal; Notable for the following components:      Result Value   Hemoglobin 11.5 (*)    MCH 25.8 (*)    RDW 16.6 (*)    Platelets 419 (*)    All other components within normal limits  COMPREHENSIVE METABOLIC PANEL - Abnormal; Notable for the following components:   Total Protein 8.4 (*)    All other components within normal limits  LIPASE, BLOOD  D-DIMER, QUANTITATIVE  HCG, SERUM, QUALITATIVE  URINALYSIS, ROUTINE W REFLEX MICROSCOPIC  TROPONIN I (HIGH SENSITIVITY)  TROPONIN I (HIGH SENSITIVITY)    EKG None  Radiology DG Chest 2 View  Result Date: 07/04/2022 CLINICAL DATA:  Shortness of breath EXAM: CHEST - 2 VIEW COMPARISON:  None Available. FINDINGS: The heart size and mediastinal contours are within normal limits. Both lungs are clear. The visualized skeletal structures are unremarkable. IMPRESSION: No active cardiopulmonary disease. Electronically Signed   By: Jasmine Pang M.D.   On: 07/04/2022 18:19    Procedures Procedures    Medications Ordered in ED Medications  dexamethasone (DECADRON) injection 10 mg (has no administration in time range)  ondansetron (ZOFRAN) injection 4 mg (4 mg Intravenous Given 07/04/22 1751)  ketorolac (TORADOL) 30 MG/ML injection 30 mg (30 mg Intravenous Given 07/04/22 1819)  methocarbamol (ROBAXIN) tablet 500 mg (500 mg Oral Given 07/04/22 1819)   ED  Course/ Medical Decision Making/ A&P   37 year old here for evaluation of back pain.  Radiates into right leg.  Has history of chronic pain.  Not currently followed by orthopedics.  No fever, history of IVDU, bowel or bladder incontinence, saddle paresthesia.  States she goes through "flares."  Pain x 1 week.  No recent falls or injuries.  States it "hurts to take a deep breath" that her pain is so bad.  She denies any overt chest pain.  She is no clinical evidence of VTE on exam.  She is without tachycardia, tachypnea or hypoxia.  Pain reproducible on exam.  Plan on labs, imaging and reassess  Labs and imaging personally viewed and interpreted: CBC without leukocytosis no metabolic panel without significant findings UA negative for infection Lipase 38 Pregnancy test negative D-dimer 0.28 Troponin 2 do not feel we need additional troponin at this time, symptoms x 1 week Chest x-ray without  cardiomegaly, pulm edema, pneumothorax EKG without ischemic changes  Patient reassessed, pain improved, she is ambulatory here in the emergency department, texting on phone sitting up in chair without difficulty on reassessment.  DC home with symptomatic manage, encourage close follow-up with orthopedics, return for new or worsening symptoms.  At this time low suspicion for acute ACS, PE, dissection, vascular occlusion, venous occlusion, cellulitis, cauda equina, discitis, osteomyelitis, transverse mass, psoas abscess, AAA, dissection  The patient has been appropriately medically screened and/or stabilized in the ED. I have low suspicion for any other emergent medical condition which would require further screening, evaluation or treatment in the ED or require inpatient management.  Patient is hemodynamically stable and in no acute distress.  Patient able to ambulate in department prior to ED.  Evaluation does not show acute pathology that would require ongoing or additional emergent interventions while in the  emergency department or further inpatient treatment.  I have discussed the diagnosis with the patient and answered all questions.  Pain is been managed while in the emergency department and patient has no further complaints prior to discharge.  Patient is comfortable with plan discussed in room and is stable for discharge at this time.  I have discussed strict return precautions for returning to the emergency department.  Patient was encouraged to follow-up with PCP/specialist refer to at discharge.                              Medical Decision Making Amount and/or Complexity of Data Reviewed External Data Reviewed: labs, radiology, ECG and notes. Labs: ordered. Decision-making details documented in ED Course. Radiology: ordered and independent interpretation performed. Decision-making details documented in ED Course. ECG/medicine tests: ordered and independent interpretation performed. Decision-making details documented in ED Course.  Risk OTC drugs. Prescription drug management. Parenteral controlled substances. Decision regarding hospitalization. Diagnosis or treatment significantly limited by social determinants of health.          Final Clinical Impression(s) / ED Diagnoses Final diagnoses:  Chronic bilateral low back pain with right-sided sciatica    Rx / DC Orders ED Discharge Orders          Ordered    predniSONE (STERAPRED UNI-PAK 21 TAB) 10 MG (21) TBPK tablet  Daily        07/04/22 1923    methocarbamol (ROBAXIN) 500 MG tablet  2 times daily        07/04/22 1923    lidocaine (LIDODERM) 5 %  Every 24 hours        07/04/22 1923              Linwood Dibbles, PA-C 07/04/22 1940    Alvira Monday, MD 07/05/22 1625

## 2022-09-27 ENCOUNTER — Other Ambulatory Visit: Payer: Self-pay

## 2022-09-27 ENCOUNTER — Encounter (HOSPITAL_BASED_OUTPATIENT_CLINIC_OR_DEPARTMENT_OTHER): Payer: Self-pay

## 2022-09-27 ENCOUNTER — Emergency Department (HOSPITAL_BASED_OUTPATIENT_CLINIC_OR_DEPARTMENT_OTHER)
Admission: EM | Admit: 2022-09-27 | Discharge: 2022-09-27 | Disposition: A | Discharge status: Home / Self Care | Payer: MEDICAID | Attending: Emergency Medicine | Admitting: Emergency Medicine

## 2022-09-27 DIAGNOSIS — M545 Low back pain, unspecified: Secondary | ICD-10-CM | POA: Diagnosis present

## 2022-09-27 DIAGNOSIS — M5431 Sciatica, right side: Secondary | ICD-10-CM

## 2022-09-27 DIAGNOSIS — M5441 Lumbago with sciatica, right side: Secondary | ICD-10-CM | POA: Diagnosis not present

## 2022-09-27 MED ORDER — METHOCARBAMOL 500 MG PO TABS: 500.0000 mg | ORAL_TABLET | Freq: Three times a day (TID) | ORAL | 0 refills | Status: DC | PRN

## 2022-09-27 MED ORDER — PREDNISONE 10 MG (21) PO TBPK: ORAL_TABLET | Freq: Every day | ORAL | 0 refills | Status: AC

## 2022-09-27 MED ORDER — PREDNISONE 10 MG (21) PO TBPK: ORAL_TABLET | Freq: Every day | ORAL | 0 refills | Status: DC

## 2022-09-27 MED ORDER — KETOROLAC TROMETHAMINE 60 MG/2ML IM SOLN
60.0000 mg | Freq: Once | INTRAMUSCULAR | Status: AC
  Administered 2022-09-27: 60 mg via INTRAMUSCULAR
  Filled 2022-09-27: qty 2

## 2022-09-27 MED ORDER — LIDOCAINE 5 % EX PTCH: 1.0000 | MEDICATED_PATCH | CUTANEOUS | 0 refills | Status: DC

## 2022-09-27 MED ORDER — LIDOCAINE 5 % EX PTCH: 1.0000 | MEDICATED_PATCH | CUTANEOUS | 0 refills | Status: AC

## 2022-09-27 MED ORDER — METHOCARBAMOL 500 MG PO TABS
500.0000 mg | ORAL_TABLET | Freq: Once | ORAL | Status: AC
  Administered 2022-09-27: 500 mg via ORAL
  Filled 2022-09-27 (×2): qty 1

## 2022-09-27 MED ORDER — DEXAMETHASONE SODIUM PHOSPHATE 10 MG/ML IJ SOLN
10.0000 mg | Freq: Once | INTRAMUSCULAR | Status: AC
  Administered 2022-09-27: 10 mg via INTRAMUSCULAR
  Filled 2022-09-27: qty 1

## 2022-09-27 NOTE — ED Triage Notes (Signed)
Pt states she is having "sciatica pain" Has hx of same Lower back and pain going down the back of both legs  rt > left Started 3 days ago

## 2022-09-27 NOTE — ED Provider Notes (Signed)
[place] [facility] Provider Note   CSN: [id number] Arrival date & time: 09/27/22  0158     History  Chief Complaint  Patient presents with   Back Pain    Stephanie Clayton is a 37 y.o. female.  Patient is a 37 year old female presenting with complaints of pain in her right low back and buttock.  She has history of sciatica and this feels the same.  Symptoms began in the absence of any injury or trauma.  She denies any bowel or bladder complaints.  She denies any weakness or numbness of the legs.  She has been given Robaxin and steroids in the past, however does not have this medication at home currently.  Over-the-counter medications are not helping.  The history is provided by the patient.       Home Medications Prior to Admission medications   Medication Sig Start Date End Date Taking? Authorizing Provider  acetaminophen (TYLENOL) 325 MG tablet Take 2 tablets (650 mg total) by mouth every 6 (six) hours as needed. 07/10/21   Sloan Leiter, DO  cephALEXin (KEFLEX) 500 MG capsule Take 1 capsule (500 mg total) by mouth 3 (three) times daily. 05/13/20   Horton, Mayer Masker, MD  HYDROcodone-acetaminophen (NORCO) 5-325 MG tablet Take 1 tablet by mouth every 4 (four) hours as needed for severe pain (for pain). 06/13/19   Molpus, John, MD  ibuprofen (ADVIL) 600 MG tablet Take 1 tablet (600 mg total) by mouth every 6 (six) hours as needed. 07/10/21   Tanda Rockers A, DO  lidocaine (LIDODERM) 5 % Place 1 patch onto the skin daily. Remove & Discard patch within 12 hours or as directed by MD 07/04/22   Henderly, Britni A, PA-C  methocarbamol (ROBAXIN) 500 MG tablet Take 1 tablet (500 mg total) by mouth 2 (two) times daily. 07/04/22   Henderly, Britni A, PA-C  methylPREDNISolone (MEDROL DOSEPAK) 4 MG TBPK tablet Per dose pack to start 10/23/2021 10/21/21   Arby Barrette, MD  predniSONE (STERAPRED UNI-PAK 21 TAB) 10 MG (21) TBPK tablet Take by mouth daily. Take 6  tabs by mouth daily  for 1 day, then 5 tabs for 1 days, then 4 tabs for 1 days, then 3 tabs for 1 days, 2 tabs for 1 days, then 1 tab by mouth daily for 1 days 07/04/22   Henderly, Britni A, PA-C      Allergies    Fentanyl    Review of Systems   Review of Systems  All other systems reviewed and are negative.   Physical Exam Updated Vital Signs BP 132/85 (BP Location: Left Arm)   Pulse 84   Temp 97.8 F (36.6 C) (Temporal)   Resp 18   Ht 5\' 4"  (1.626 m)   Wt 86.2 kg   LMP 09/23/2022   SpO2 95%   BMI 32.61 kg/m  Physical Exam Vitals and nursing note reviewed.  Constitutional:      Appearance: Normal appearance.  Pulmonary:     Effort: Pulmonary effort is normal.  Musculoskeletal:     Comments: There is tenderness to palpation in the soft tissues of the right lower lumbar region and buttock.  There is no visible or palpable abnormality.  Skin:    General: Skin is warm and dry.  Neurological:     General: No focal deficit present.     Mental Status: She is alert and oriented to person, place, and time.     Comments: DTRs are trace  and symmetrical in both lower extremities.  Strength is 5 out of 5 in both lower extremities.  She is able to ambulate.     ED Results / Procedures / Treatments   Labs (all labs ordered are listed, but only abnormal results are displayed) Labs Reviewed - No data to display  EKG None  Radiology No results found.  Procedures Procedures    Medications Ordered in ED Medications  dexamethasone (DECADRON) injection 10 mg (has no administration in time range)  methocarbamol (ROBAXIN) tablet 500 mg (has no administration in time range)  ketorolac (TORADOL) injection 60 mg (has no administration in time range)    ED Course/ Medical Decision Making/ A&P  Patient presenting with recurrent sciatica.  She will be treated with prednisone Robaxin, and Lidoderm patches.  Final Clinical Impression(s) / ED Diagnoses Final diagnoses:  None     Rx / DC Orders ED Discharge Orders     None         Geoffery Lyons, MD 09/27/22 [phone]

## 2022-09-27 NOTE — Discharge Instructions (Signed)
Begin taking prednisone as prescribed.  Begin taking Robaxin and using Lidoderm patches as prescribed.  Follow-up with your primary doctor.

## 2022-10-28 ENCOUNTER — Other Ambulatory Visit: Payer: Self-pay

## 2022-10-28 ENCOUNTER — Emergency Department (HOSPITAL_BASED_OUTPATIENT_CLINIC_OR_DEPARTMENT_OTHER)
Admission: EM | Admit: 2022-10-28 | Discharge: 2022-10-28 | Disposition: A | Discharge status: Home / Self Care | Payer: MEDICAID | Attending: Emergency Medicine | Admitting: Emergency Medicine

## 2022-10-28 ENCOUNTER — Encounter (HOSPITAL_BASED_OUTPATIENT_CLINIC_OR_DEPARTMENT_OTHER): Payer: Self-pay

## 2022-10-28 ENCOUNTER — Emergency Department (HOSPITAL_BASED_OUTPATIENT_CLINIC_OR_DEPARTMENT_OTHER): Payer: MEDICAID

## 2022-10-28 DIAGNOSIS — R1013 Epigastric pain: Secondary | ICD-10-CM | POA: Diagnosis not present

## 2022-10-28 DIAGNOSIS — R079 Chest pain, unspecified: Secondary | ICD-10-CM | POA: Insufficient documentation

## 2022-10-28 DIAGNOSIS — R1010 Upper abdominal pain, unspecified: Secondary | ICD-10-CM | POA: Diagnosis present

## 2022-10-28 DIAGNOSIS — Z87891 Personal history of nicotine dependence: Secondary | ICD-10-CM | POA: Diagnosis not present

## 2022-10-28 DIAGNOSIS — Z1152 Encounter for screening for COVID-19: Secondary | ICD-10-CM | POA: Insufficient documentation

## 2022-10-28 LAB — BASIC METABOLIC PANEL
Anion gap: 9 (ref 5–15)
BUN: 17 mg/dL (ref 6–20)
CO2: 23 mmol/L (ref 22–32)
Calcium: 9 mg/dL (ref 8.9–10.3)
Chloride: 104 mmol/L (ref 98–111)
Creatinine, Ser: 0.81 mg/dL (ref 0.44–1.00)
GFR, Estimated: >60 mL/min (ref >60)
Glucose, Bld: 100 mg/dL — ABNORMAL HIGH (ref 70–99)
Potassium: 3.9 mmol/L (ref 3.5–5.1)
Sodium: 136 mmol/L (ref 135–145)

## 2022-10-28 LAB — SARS CORONAVIRUS 2 BY RT PCR: SARS Coronavirus 2 by RT PCR: NEGATIVE

## 2022-10-28 LAB — HEPATIC FUNCTION PANEL
ALT: 20 U/L (ref 0–44)
AST: 22 U/L (ref 15–41)
Albumin: 3.7 g/dL (ref 3.5–5.0)
Alkaline Phosphatase: 58 U/L (ref 38–126)
Bilirubin, Direct: 0.1 mg/dL (ref 0.0–0.2)
Indirect Bilirubin: 0.4 mg/dL (ref 0.3–0.9)
Total Bilirubin: 0.5 mg/dL (ref 0.3–1.2)
Total Protein: 7.6 g/dL (ref 6.5–8.1)

## 2022-10-28 LAB — CBC WITH DIFFERENTIAL/PLATELET
Abs Immature Granulocytes: 0.01 10*3/uL (ref 0.00–0.07)
Basophils Absolute: 0 10*3/uL (ref 0.0–0.1)
Basophils Relative: 0 %
Eosinophils Absolute: 0.2 10*3/uL (ref 0.0–0.5)
Eosinophils Relative: 2 %
HCT: 34.9 % — ABNORMAL LOW (ref 36.0–46.0)
Hemoglobin: 10.8 g/dL — ABNORMAL LOW (ref 12.0–15.0)
Immature Granulocytes: 0 %
Lymphocytes Relative: 27 %
Lymphs Abs: 2 10*3/uL (ref 0.7–4.0)
MCH: 25.5 pg — ABNORMAL LOW (ref 26.0–34.0)
MCHC: 30.9 g/dL (ref 30.0–36.0)
MCV: 82.3 fL (ref 80.0–100.0)
Monocytes Absolute: 0.8 10*3/uL (ref 0.1–1.0)
Monocytes Relative: 11 %
Neutro Abs: 4.4 10*3/uL (ref 1.7–7.7)
Neutrophils Relative %: 60 %
Platelets: 308 10*3/uL (ref 150–400)
RBC: 4.24 MIL/uL (ref 3.87–5.11)
RDW: 15.8 % — ABNORMAL HIGH (ref 11.5–15.5)
WBC: 7.4 10*3/uL (ref 4.0–10.5)
nRBC: 0 % (ref 0.0–0.2)

## 2022-10-28 LAB — LIPASE, BLOOD: Lipase: 31 U/L (ref 11–51)

## 2022-10-28 LAB — D-DIMER, QUANTITATIVE: D-Dimer, Quant: <0.27 ug{FEU}/mL (ref 0.00–0.50)

## 2022-10-28 LAB — TROPONIN I (HIGH SENSITIVITY)
Troponin I (High Sensitivity): 3 ng/L (ref <18)
Troponin I (High Sensitivity): 3 ng/L (ref <18)

## 2022-10-28 LAB — PREGNANCY, URINE: Preg Test, Ur: NEGATIVE

## 2022-10-28 MED ORDER — KETOROLAC TROMETHAMINE 15 MG/ML IJ SOLN
15.0000 mg | Freq: Once | INTRAMUSCULAR | Status: AC
  Administered 2022-10-28: 15 mg via INTRAVENOUS
  Filled 2022-10-28: qty 1

## 2022-10-28 MED ORDER — FAMOTIDINE 40 MG PO TABS: 40.0000 mg | ORAL_TABLET | Freq: Every day | ORAL | 0 refills | Status: AC

## 2022-10-28 MED ORDER — ALUM & MAG HYDROXIDE-SIMETH 200-200-20 MG/5ML PO SUSP
30.0000 mL | Freq: Once | ORAL | Status: AC
  Administered 2022-10-28: 30 mL via ORAL
  Filled 2022-10-28: qty 30

## 2022-10-28 NOTE — ED Triage Notes (Signed)
The patient having chest pain and shortness of breath since 11am. No cough or fever.

## 2022-10-28 NOTE — Discharge Instructions (Signed)
We evaluated you for your chest and abdominal pain.  Your testing was reassuring.  We did not see any signs of any heart injury, blood clot in your lungs, or problems with your liver or pancreas.  I believe the most likely cause of your pain is stomach inflammation.  I prescribed you an antiacid medication.  Please take this daily if for now.  You can also buy a medication called Mylanta over-the-counter to help with the symptoms.  If you do have any new or worsening symptoms such as severe pain, uncontrolled vomiting, fevers or chills, lightheadedness or dizziness, or any other new symptoms, please return to the emergency department.

## 2022-10-28 NOTE — ED Provider Notes (Signed)
[place] [facility] DEPARTMENT AT [facility] Provider Note  CSN: [id number] Arrival date & time: 10/28/22 1553  Chief Complaint(s) Shortness of Breath and Chest Pain  HPI Stephanie Clayton is a 37 y.o. female with history of sciatica presenting to the emergency department chest pain.  Patient reports chest pain starting today, in the lower chest/upper abdomen.  Reports it is pleuritic, also feels some shortness of breath.  Denies similar episode in the past.  No recent travel or surgeries.  No history of DVT/PE.  No cough or hemoptysis.  No nausea or vomiting.  No back pain.  No leg swelling.  Symptoms began this morning   Past Medical History Past Medical History:  Diagnosis Date   Prediabetes    Sciatic leg pain    Patient Active Problem List   Diagnosis Date Noted   TOBACCO ABUSE 05/14/2007   ABSENCE OF MENSTRUATION 05/14/2007   PAIN IN SOFT TISSUES OF LIMB 03/16/2007   Home Medication(s) Prior to Admission medications   Medication Sig Start Date End Date Taking? Authorizing Provider  famotidine (PEPCID) 40 MG tablet Take 1 tablet (40 mg total) by mouth at bedtime. 10/28/22  Yes Lonell Grandchild, MD  acetaminophen (TYLENOL) 325 MG tablet Take 2 tablets (650 mg total) by mouth every 6 (six) hours as needed. 07/10/21   Sloan Leiter, DO  cephALEXin (KEFLEX) 500 MG capsule Take 1 capsule (500 mg total) by mouth 3 (three) times daily. 05/13/20   Horton, Mayer Masker, MD  HYDROcodone-acetaminophen (NORCO) 5-325 MG tablet Take 1 tablet by mouth every 4 (four) hours as needed for severe pain (for pain). 06/13/19   Molpus, John, MD  ibuprofen (ADVIL) 600 MG tablet Take 1 tablet (600 mg total) by mouth every 6 (six) hours as needed. 07/10/21   Tanda Rockers A, DO  lidocaine (LIDODERM) 5 % Place 1 patch onto the skin daily. Remove & Discard patch within 12 hours or as directed by MD 09/27/22   Geoffery Lyons, MD  methocarbamol (ROBAXIN) 500 MG tablet Take 1 tablet (500 mg total) by  mouth every 8 (eight) hours as needed for muscle spasms. 09/27/22   Geoffery Lyons, MD  methylPREDNISolone (MEDROL DOSEPAK) 4 MG TBPK tablet Per dose pack to start 10/23/2021 10/21/21   Arby Barrette, MD  predniSONE (STERAPRED UNI-PAK 21 TAB) 10 MG (21) TBPK tablet Take by mouth daily. Take 6 tabs by mouth daily  for 2 days, then 5 tabs for 2 days, then 4 tabs for 2 days, then 3 tabs for 2 days, 2 tabs for 2 days, then 1 tab by mouth daily for 2 days 09/27/22   Geoffery Lyons, MD                                                                                                                                    Past Surgical History Past Surgical History:  Procedure Laterality Date   CESAREAN  SECTION     CHOLECYSTECTOMY     TUBAL LIGATION     Family History History reviewed. No pertinent family history.  Social History Social History   Tobacco Use   Smoking status: Former    Types: Cigarettes   Smokeless tobacco: Never  Vaping Use   Vaping status: Former   Quit date: 02/23/2021  Substance Use Topics   Alcohol use: Not Currently    Comment: occ   Drug use: Not Currently    Types: Marijuana   Allergies Fentanyl  Review of Systems Review of Systems  All other systems reviewed and are negative.   Physical Exam Vital Signs  I have reviewed the triage vital signs BP 122/82   Pulse 70   Temp 98.2 F (36.8 C) (Oral)   Resp (!) 23   Ht 5\' 4"  (1.626 m)   Wt 86 kg   LMP 10/12/2022 (Exact Date)   SpO2 100%   BMI 32.54 kg/m  Physical Exam Vitals and nursing note reviewed.  Constitutional:      General: She is not in acute distress.    Appearance: She is well-developed.  HENT:     Head: Normocephalic and atraumatic.     Mouth/Throat:     Mouth: Mucous membranes are moist.  Eyes:     Pupils: Pupils are equal, round, and reactive to light.  Cardiovascular:     Rate and Rhythm: Normal rate and regular rhythm.     Heart sounds: No murmur heard. Pulmonary:     Effort: Pulmonary  effort is normal. No respiratory distress.     Breath sounds: Normal breath sounds.  Abdominal:     General: Abdomen is flat.     Palpations: Abdomen is soft.     Tenderness: There is abdominal tenderness (minimal epigastric).  Musculoskeletal:        General: No tenderness.     Right lower leg: No edema.     Left lower leg: No edema.  Skin:    General: Skin is warm and dry.  Neurological:     General: No focal deficit present.     Mental Status: She is alert. Mental status is at baseline.  Psychiatric:        Mood and Affect: Mood normal.        Behavior: Behavior normal.     ED Results and Treatments Labs (all labs ordered are listed, but only abnormal results are displayed) Labs Reviewed  BASIC METABOLIC PANEL - Abnormal; Notable for the following components:      Result Value   Glucose, Bld 100 (*)    All other components within normal limits  CBC WITH DIFFERENTIAL/PLATELET - Abnormal; Notable for the following components:   Hemoglobin 10.8 (*)    HCT 34.9 (*)    MCH 25.5 (*)    RDW 15.8 (*)    All other components within normal limits  SARS CORONAVIRUS 2 BY RT PCR  PREGNANCY, URINE  LIPASE, BLOOD  HEPATIC FUNCTION PANEL  D-DIMER, QUANTITATIVE  TROPONIN I (HIGH SENSITIVITY)  TROPONIN I (HIGH SENSITIVITY)  Radiology DG Chest Port 1 View  Result Date: 10/28/2022 CLINICAL DATA:  Shortness of breath. EXAM: PORTABLE CHEST 1 VIEW COMPARISON:  Jul 04, 2022. FINDINGS: The heart size and mediastinal contours are within normal limits. Both lungs are clear. The visualized skeletal structures are unremarkable. IMPRESSION: No active disease. Electronically Signed   By: Lupita Raider M.D.   On: 10/28/2022 17:40    Pertinent labs & imaging results that were available during my care of the patient were reviewed by me and considered in my medical decision making  (see MDM for details).  Medications Ordered in ED Medications  ketorolac (TORADOL) 15 MG/ML injection 15 mg (15 mg Intravenous Given 10/28/22 1736)  alum & mag hydroxide-simeth (MAALOX/MYLANTA) 200-200-20 MG/5ML suspension 30 mL (30 mLs Oral Given 10/28/22 1847)                                                                                                                                     Procedures Ultrasound ED Peripheral IV (Provider)  Date/Time: 10/28/2022 6:46 PM  Performed by: Lonell Grandchild, MD Authorized by: Lonell Grandchild, MD   Procedure details:    Indications: multiple failed IV attempts and poor IV access     Skin Prep: chlorhexidine gluconate     Location:  Left AC   Angiocath:  18 G   Bedside Ultrasound Guided: Yes     Images: not archived     Patient tolerated procedure without complications: Yes     Dressing applied: Yes     (including critical care time)  Medical Decision Making / ED Course   MDM:  37 year old female presenting to the emergency department shortness of breath, chest pain.  Patient well-appearing, physical exam with mild epigastric tenderness, otherwise unremarkable, looks clear.  Vitals reassuring, minimal tachypnea.  Unclear cause of symptoms, considered PE will check D-dimer, doubt pneumonia or pneumothorax will check chest x-ray.  Does have some very mild abdominal tenderness that could also represent gastritis, lower concern for cholecystitis or pancreatitis though will check lipase and LFTs.  Doubt any cardiac cause but will check a troponin.  Will need single troponin only symptoms started more than 2 hours ago.  We will give her Toradol, reassess.  If this does not help may try GI cocktail or alternative medication. Clinical Course as of 10/28/22 1847  Caleen Essex Oct 28, 2022  1610 Patient reports mild improvement from the Toradol, discussed results including negative labs, D-dimer and troponin.  Offered trial of Maalox in the emergency  department and reassessment to see if would help her symptoms but patient reports that she feels well enough to just go home and try antiacid medication at home.  Will give dose of Maalox prior to discharge.  Low concern for other dangerous pathology such as acute intra-abdominal process such as perforation, obstruction.  Will prescribe Pepcid.  Advise follow-up with primary physician. Will discharge patient to home.  All questions answered. Patient comfortable with plan of discharge. Return precautions discussed with patient and specified on the after visit summary.  [WS]    Clinical Course User Index [WS] Lonell Grandchild, MD     Lab Tests: -I ordered, reviewed, and interpreted labs.   The pertinent results include:   Labs Reviewed  BASIC METABOLIC PANEL - Abnormal; Notable for the following components:      Result Value   Glucose, Bld 100 (*)    All other components within normal limits  CBC WITH DIFFERENTIAL/PLATELET - Abnormal; Notable for the following components:   Hemoglobin 10.8 (*)    HCT 34.9 (*)    MCH 25.5 (*)    RDW 15.8 (*)    All other components within normal limits  SARS CORONAVIRUS 2 BY RT PCR  PREGNANCY, URINE  LIPASE, BLOOD  HEPATIC FUNCTION PANEL  D-DIMER, QUANTITATIVE  TROPONIN I (HIGH SENSITIVITY)  TROPONIN I (HIGH SENSITIVITY)    Notable for normal lipase, lfts, d-dimer, troponin  EKG   EKG Interpretation Date/Time:  Friday October 28 2022 16:03:28 EDT Ventricular Rate:  77 PR Interval:  174 QRS Duration:  85 QT Interval:  445 QTC Calculation: 504 R Axis:   55  Text Interpretation: Sinus rhythm Borderline prolonged QT interval Baseline wander in lead(s) V5 Confirmed by Alvino Blood ([id number]) on 10/28/2022 4:08:29 PM         Imaging Studies ordered: I ordered imaging studies including CXR On my interpretation imaging demonstrates no acute process I independently visualized and interpreted imaging. I agree with the radiologist  interpretation   Medicines ordered and prescription drug management: Meds ordered this encounter  Medications   ketorolac (TORADOL) 15 MG/ML injection 15 mg   alum & mag hydroxide-simeth (MAALOX/MYLANTA) 200-200-20 MG/5ML suspension 30 mL   famotidine (PEPCID) 40 MG tablet    Sig: Take 1 tablet (40 mg total) by mouth at bedtime.    Dispense:  30 tablet    Refill:  0    -I have reviewed the patients home medicines and have made adjustments as needed  Social Determinants of Health:  Diagnosis or treatment significantly limited by social determinants of health: obesity   Reevaluation: After the interventions noted above, I reevaluated the patient and found that their symptoms have improved  Co morbidities that complicate the patient evaluation  Past Medical History:  Diagnosis Date   Prediabetes    Sciatic leg pain       Dispostion: Disposition decision including need for hospitalization was considered, and patient discharged from emergency department.    Final Clinical Impression(s) / ED Diagnoses Final diagnoses:  Epigastric pain     This chart was dictated using voice recognition software.  Despite best efforts to proofread,  errors can occur which can change the documentation meaning.    Lonell Grandchild, MD 10/28/22 [phone]

## 2022-10-28 NOTE — ED Notes (Signed)
Pt difficult IV stick. Attempted x 2 RN's. EDP notified and able to obtain site left [facility] via [place]

## 2022-10-29 ENCOUNTER — Emergency Department (HOSPITAL_BASED_OUTPATIENT_CLINIC_OR_DEPARTMENT_OTHER)
Admission: EM | Admit: 2022-10-29 | Discharge: 2022-10-29 | Disposition: A | Discharge status: Home / Self Care | Payer: MEDICAID | Attending: Emergency Medicine | Admitting: Emergency Medicine

## 2022-10-29 ENCOUNTER — Other Ambulatory Visit: Payer: Self-pay

## 2022-10-29 ENCOUNTER — Emergency Department (HOSPITAL_BASED_OUTPATIENT_CLINIC_OR_DEPARTMENT_OTHER): Payer: MEDICAID

## 2022-10-29 ENCOUNTER — Encounter (HOSPITAL_BASED_OUTPATIENT_CLINIC_OR_DEPARTMENT_OTHER): Payer: Self-pay

## 2022-10-29 DIAGNOSIS — R1013 Epigastric pain: Secondary | ICD-10-CM | POA: Diagnosis present

## 2022-10-29 DIAGNOSIS — R0602 Shortness of breath: Secondary | ICD-10-CM | POA: Insufficient documentation

## 2022-10-29 LAB — CBC
HCT: 34.5 % — ABNORMAL LOW (ref 36.0–46.0)
Hemoglobin: 10.8 g/dL — ABNORMAL LOW (ref 12.0–15.0)
MCH: 25.8 pg — ABNORMAL LOW (ref 26.0–34.0)
MCHC: 31.3 g/dL (ref 30.0–36.0)
MCV: 82.3 fL (ref 80.0–100.0)
Platelets: 283 10*3/uL (ref 150–400)
RBC: 4.19 MIL/uL (ref 3.87–5.11)
RDW: 15.9 % — ABNORMAL HIGH (ref 11.5–15.5)
WBC: 6.9 10*3/uL (ref 4.0–10.5)
nRBC: 0 % (ref 0.0–0.2)

## 2022-10-29 LAB — COMPREHENSIVE METABOLIC PANEL
ALT: 15 U/L (ref 0–44)
AST: 21 U/L (ref 15–41)
Albumin: 3.7 g/dL (ref 3.5–5.0)
Alkaline Phosphatase: 54 U/L (ref 38–126)
Anion gap: 8 (ref 5–15)
BUN: 22 mg/dL — ABNORMAL HIGH (ref 6–20)
CO2: 23 mmol/L (ref 22–32)
Calcium: 8.8 mg/dL — ABNORMAL LOW (ref 8.9–10.3)
Chloride: 105 mmol/L (ref 98–111)
Creatinine, Ser: 0.89 mg/dL (ref 0.44–1.00)
GFR, Estimated: >60 mL/min (ref >60)
Glucose, Bld: 92 mg/dL (ref 70–99)
Potassium: 3.8 mmol/L (ref 3.5–5.1)
Sodium: 136 mmol/L (ref 135–145)
Total Bilirubin: 0.4 mg/dL (ref 0.3–1.2)
Total Protein: 7.6 g/dL (ref 6.5–8.1)

## 2022-10-29 LAB — LIPASE, BLOOD: Lipase: 35 U/L (ref 11–51)

## 2022-10-29 MED ORDER — HYDROMORPHONE HCL 1 MG/ML IJ SOLN: 0.5000 mg | Freq: Once | INTRAMUSCULAR | Status: DC

## 2022-10-29 MED ORDER — MORPHINE SULFATE 15 MG PO TABS
7.5000 mg | ORAL_TABLET | ORAL | 0 refills | Status: AC | PRN
Start: 2022-10-29 — End: ?

## 2022-10-29 MED ORDER — KETOROLAC TROMETHAMINE 15 MG/ML IJ SOLN
15.0000 mg | Freq: Once | INTRAMUSCULAR | Status: AC
  Administered 2022-10-29: 15 mg via INTRAVENOUS
  Filled 2022-10-29: qty 1

## 2022-10-29 MED ORDER — ONDANSETRON HCL 4 MG/2ML IJ SOLN
4.0000 mg | Freq: Once | INTRAMUSCULAR | Status: AC
  Administered 2022-10-29: 4 mg via INTRAVENOUS
  Filled 2022-10-29: qty 2

## 2022-10-29 MED ORDER — IOHEXOL 300 MG/ML  SOLN
100.0000 mL | Freq: Once | INTRAMUSCULAR | Status: AC | PRN
  Administered 2022-10-29: 100 mL via INTRAVENOUS

## 2022-10-29 MED ORDER — LACTATED RINGERS IV BOLUS
1000.0000 mL | Freq: Once | INTRAVENOUS | Status: AC
  Administered 2022-10-29: 1000 mL via INTRAVENOUS

## 2022-10-29 NOTE — Discharge Instructions (Addendum)
Dear Ms Schafer, It was a pleasure taking care of you at the ED. We are discharging you home, your CT scan looked concerning for a metastatic primary malignancy that will need to be followed up. Please follow the following instructions.  1) please follow-up with your PCP and have them schedule you oncology for review of your CT of abdomen and pelvis findings. 2) if however ,you are unable to get scheduled with an oncologist through your PCP ,as soon as possible please reach out to [place] and we will give you the oncologist on-call information so you can contact them for sooner appointment 3) I am prescribing you some pain medication that she can use to control your pain until you are able to see your primary care physician 4) Please do not hesitate to contact us if you have any other concerns  Take care,  Dr. Kathleen Lime, MD   Take 4 over the counter ibuprofen tablets 3 times a day or 2 over-the-counter naproxen tablets twice a day for pain. Also take tylenol 1000mg (2 extra strength) four times a day.   Then take the pain medicine if you feel like you need it. Narcotics do not help with the pain, they only make you care about it less.  You can become addicted to this, people may break into your house to steal it.  It will constipate you.  If you drive under the influence of this medicine you can get a DUI.

## 2022-10-29 NOTE — ED Provider Notes (Signed)
[place] [facility]PART[facility] AT [facility] Provider Note   CSN: [id number] Arrival date & time: 10/29/22  2016     History  Chief Complaint  Patient presents with   Abdominal Pain    Stephanie Clayton is a 37 y.o. female.   Abdominal Pain Pain location:  Epigastric Pain quality: sharp and shooting   Associated symptoms: no chills, no constipation, no fatigue, no fever and no nausea    37 year old female presenting to the ED that she was discharged yesterday concerns of persistent abdominal pain that she rates at 7 out of 10, sharp, due to down her right breast.  She said nothing makes it worse ,nothing makes it better.  She was given famotidine yesterday but said it has not helped much.    Home Medications Prior to Admission medications   Medication Sig Start Date End Date Taking? Authorizing Provider  morphine (MSIR) 15 MG tablet Take 0.5 tablets (7.5 mg total) by mouth every 4 (four) hours as needed for severe pain. 10/29/22  Yes Melene Plan, DO  acetaminophen (TYLENOL) 325 MG tablet Take 2 tablets (650 mg total) by mouth every 6 (six) hours as needed. 07/10/21   Sloan Leiter, DO  cephALEXin (KEFLEX) 500 MG capsule Take 1 capsule (500 mg total) by mouth 3 (three) times daily. 05/13/20   Horton, Mayer Masker, MD  famotidine (PEPCID) 40 MG tablet Take 1 tablet (40 mg total) by mouth at bedtime. 10/28/22   Lonell Grandchild, MD  HYDROcodone-acetaminophen (NORCO) 5-325 MG tablet Take 1 tablet by mouth every 4 (four) hours as needed for severe pain (for pain). 06/13/19   Molpus, John, MD  ibuprofen (ADVIL) 600 MG tablet Take 1 tablet (600 mg total) by mouth every 6 (six) hours as needed. 07/10/21   Tanda Rockers A, DO  lidocaine (LIDODERM) 5 % Place 1 patch onto the skin daily. Remove & Discard patch within 12 hours or as directed by MD 09/27/22   Geoffery Lyons, MD  methocarbamol (ROBAXIN) 500 MG tablet Take 1 tablet (500 mg total) by mouth every 8 (eight) hours as needed for  muscle spasms. 09/27/22   Geoffery Lyons, MD  methylPREDNISolone (MEDROL DOSEPAK) 4 MG TBPK tablet Per dose pack to start 10/23/2021 10/21/21   Arby Barrette, MD  predniSONE (STERAPRED UNI-PAK 21 TAB) 10 MG (21) TBPK tablet Take by mouth daily. Take 6 tabs by mouth daily  for 2 days, then 5 tabs for 2 days, then 4 tabs for 2 days, then 3 tabs for 2 days, 2 tabs for 2 days, then 1 tab by mouth daily for 2 days 09/27/22   Geoffery Lyons, MD      Allergies    Fentanyl    Review of Systems   Review of Systems  Constitutional:  Negative for chills, diaphoresis, fatigue and fever.  Gastrointestinal:  Positive for abdominal distention and abdominal pain. Negative for constipation and nausea.    Physical Exam Updated Vital Signs BP 122/75   Pulse 62   Temp 98.6 F (37 C) (Oral)   Resp 17   Ht 5\' 4"  (1.626 m)   Wt 86 kg   LMP 10/12/2022 (Exact Date)   SpO2 100%   BMI 32.54 kg/m  Physical Exam Abdominal:     General: Abdomen is flat.     Palpations: Abdomen is soft. There is no shifting dullness or fluid wave.     Comments: No guarding or any signs of peritonitis      ED  Results / Procedures / Treatments   Labs (all labs ordered are listed, but only abnormal results are displayed) Labs Reviewed  CBC - Abnormal; Notable for the following components:      Result Value   Hemoglobin 10.8 (*)    HCT 34.5 (*)    MCH 25.8 (*)    RDW 15.9 (*)    All other components within normal limits  COMPREHENSIVE METABOLIC PANEL - Abnormal; Notable for the following components:   BUN 22 (*)    Calcium 8.8 (*)    All other components within normal limits  LIPASE, BLOOD    EKG EKG Interpretation Date/Time:  Saturday October 29 2022 20:38:12 EDT Ventricular Rate:  63 PR Interval:  184 QRS Duration:  99 QT Interval:  464 QTC Calculation: 475 R Axis:   27  Text Interpretation: Sinus rhythm No significant change since last tracing Confirmed by Melene Plan [phone]) on 10/29/2022 8:47:52  PM  Radiology CT ABDOMEN PELVIS W CONTRAST  Result Date: 10/29/2022 CLINICAL DATA:  Abdominal pain. EXAM: CT ABDOMEN AND PELVIS WITH CONTRAST TECHNIQUE: Multidetector CT imaging of the abdomen and pelvis was performed using the standard protocol following bolus administration of intravenous contrast. RADIATION DOSE REDUCTION: This exam was performed according to the departmental dose-optimization program which includes automated exposure control, adjustment of the mA and/or kV according to patient size and/or use of iterative reconstruction technique. CONTRAST:  OMNIPAQUE IOHEXOL 300 MG/ML  SOLN COMPARISON:  CT abdomen pelvis dated 11/10/2012. FINDINGS: Lower chest: The visualized lung bases are clear. No intra-abdominal free air.  Small free fluid within the pelvis. Hepatobiliary: Faint 9 mm enhancing focus in the dome of the liver (12/301) is not characterized, but likely a small flash filling hemangioma or portal venous shunting. The liver is otherwise unremarkable. Mild biliary dilatation, post cholecystectomy. No retained calcified stone noted in the central CBD. Pancreas: Unremarkable. No pancreatic ductal dilatation or surrounding inflammatory changes. Spleen: Normal in size without focal abnormality. Adrenals/Urinary Tract: The adrenal glands are unremarkable. The kidneys, visualized ureters, and urinary bladder appear unremarkable. Stomach/Bowel: There is moderate stool throughout the colon. There is no bowel obstruction or active inflammation. The appendix is normal. Vascular/Lymphatic: The abdominal aorta and IVC are unremarkable. No portal venous gas. There is no adenopathy. Reproductive: The uterus is anteverted and grossly unremarkable. Apparent small fatty lesions in the right ovary measuring up to 2 cm in combined length likely edema. Further evaluation with pelvic ultrasound recommended. The left ovary is grossly unremarkable. Other: None Musculoskeletal: Scattered osseous sclerotic  lesions new since the prior CT and consistent with metastatic disease. Correlation with history of known primary malignancy recommended. A 3 cm lucent lesion with sclerotic margins in the posterior right iliac bone as seen on the prior CT of 2014, benign. IMPRESSION: 1. No bowel obstruction. Normal appendix. 2. Small fatty lesions in the right ovary. Further evaluation with pelvic ultrasound recommended. 3. Scattered osseous sclerotic lesions consistent with metastatic disease. Correlation with history of known primary malignancy recommended. Electronically Signed   By: Elgie Collard M.D.   On: 10/29/2022 22:46   DG Chest Port 1 View  Result Date: 10/28/2022 CLINICAL DATA:  Shortness of breath. EXAM: PORTABLE CHEST 1 VIEW COMPARISON:  Jul 04, 2022. FINDINGS: The heart size and mediastinal contours are within normal limits. Both lungs are clear. The visualized skeletal structures are unremarkable. IMPRESSION: No active disease. Electronically Signed   By: Lupita Raider M.D.   On: 10/28/2022 17:40  Procedures Procedures    Medications Ordered in ED Medications  lactated ringers bolus 1,000 mL (1,000 mLs Intravenous New Bag/Given 10/29/22 2127)  ondansetron (ZOFRAN) injection 4 mg (4 mg Intravenous Given 10/29/22 2127)  ketorolac (TORADOL) 15 MG/ML injection 15 mg (15 mg Intravenous Given 10/29/22 2133)  iohexol (OMNIPAQUE) 300 MG/ML solution 100 mL (100 mLs Intravenous Contrast Given 10/29/22 2207)    ED Course/ Medical Decision Making/ A&P                                 Medical Decision Making Amount and/or Complexity of Data Reviewed Labs: ordered. Radiology: ordered.  Risk Prescription drug management.   This patient is a 37 y.o. female who presents to the ED for concern of Abdominal pain , this involves an extensive number of treatment options, and is a complaint that carries with it a high risk of complications and morbidity. The emergent differential diagnosis prior to evaluation  includes, but is not limited to, GERD ,Pancreatis, appendicitis,ACS. This is not an exhaustive differential.   Additional history: Chart reviewed revealed patient was seen yesterday and workup did not reveal any acute intra-abdominal pathology  Lab Tests: I ordered CBC,CMP ,LIPASE and personally interpreted labs.The patient's labs remained fairy unchanged compared to yesterday   Imaging Studies: I ordered imaging studies including CT Abdomen and Pelvis with contrast . I independently visualized and interpreted imaging which showed Scattered osseous sclerotic lesions consistent with metastatic disease. Correlation with history of known primary malignancy recommended. I agree with the radiologist interpretation.   Medications: I ordered medication including Toradol  for pain and Zofran for nausea . Reevaluation of the patient after these medicines showed that the patient improved. I have reviewed the patients home medicines and have made adjustments as needed.  Disposition: After consideration of the diagnostic results and the patients response to treatment, I feel that there is more to this abdominal pain based on the CT  reports. I discussed the findings with the patient and she is agreeable to discharging and having an immediate follow up with her PCP for an oncology set up .  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  I discussed this case with my attending physician Dr. Adela Lank who cosigned this note including patient's presenting symptoms, physical exam, and planned diagnostics and interventions. Attending physician stated agreement with plan or made changes to plan which were implemented.           Final Clinical Impression(s) / ED Diagnoses Final diagnoses:  Epigastric pain    Rx / DC Orders ED Discharge Orders          Ordered    morphine (MSIR) 15 MG tablet  Every 4 hours PRN        10/29/22 2310               Kathleen Lime, MD 10/29/22 2313    Melene Plan, DO 10/30/22 1459

## 2022-10-29 NOTE — ED Notes (Signed)
Patient transported to CT 

## 2022-10-29 NOTE — ED Triage Notes (Signed)
POV from home, A&O x 4, GCS 15, amb to room  Pt sts upper epigastric pain, seen yesterday for same, feels like a pressure when breathing, given pepsid yesterday without improvement.

## 2022-12-14 ENCOUNTER — Emergency Department (HOSPITAL_BASED_OUTPATIENT_CLINIC_OR_DEPARTMENT_OTHER)
Admission: EM | Admit: 2022-12-14 | Discharge: 2022-12-14 | Disposition: A | Discharge status: Home / Self Care | Payer: MEDICAID | Attending: Emergency Medicine | Admitting: Emergency Medicine

## 2022-12-14 ENCOUNTER — Other Ambulatory Visit: Payer: Self-pay

## 2022-12-14 ENCOUNTER — Encounter (HOSPITAL_BASED_OUTPATIENT_CLINIC_OR_DEPARTMENT_OTHER): Payer: Self-pay

## 2022-12-14 DIAGNOSIS — J020 Streptococcal pharyngitis: Secondary | ICD-10-CM | POA: Diagnosis not present

## 2022-12-14 DIAGNOSIS — J029 Acute pharyngitis, unspecified: Secondary | ICD-10-CM | POA: Diagnosis present

## 2022-12-14 DIAGNOSIS — G8929 Other chronic pain: Secondary | ICD-10-CM

## 2022-12-14 LAB — GROUP A STREP BY PCR: Group A Strep by PCR: DETECTED — AB

## 2022-12-14 MED ORDER — KETOROLAC TROMETHAMINE 30 MG/ML IJ SOLN
30.0000 mg | Freq: Once | INTRAMUSCULAR | Status: AC
  Administered 2022-12-14: 30 mg via INTRAMUSCULAR
  Filled 2022-12-14: qty 1

## 2022-12-14 MED ORDER — KETOROLAC TROMETHAMINE 10 MG PO TABS: 10.0000 mg | ORAL_TABLET | Freq: Four times a day (QID) | ORAL | 0 refills | Status: AC | PRN

## 2022-12-14 MED ORDER — AMOXICILLIN 500 MG PO CAPS
500.0000 mg | ORAL_CAPSULE | Freq: Once | ORAL | Status: AC
  Administered 2022-12-14: 500 mg via ORAL
  Filled 2022-12-14: qty 1

## 2022-12-14 MED ORDER — AMOXICILLIN 500 MG PO CAPS: 500.0000 mg | ORAL_CAPSULE | Freq: Two times a day (BID) | ORAL | 0 refills | Status: AC

## 2022-12-14 MED ORDER — METHOCARBAMOL 500 MG PO TABS: 500.0000 mg | ORAL_TABLET | Freq: Three times a day (TID) | ORAL | 0 refills | Status: AC | PRN

## 2022-12-14 NOTE — ED Triage Notes (Signed)
Pt presents with complaints of lower back pain that radiates to the right hip and leg. States she was prescribed muscle relaxer and motrin but has not been helping. Pt also report sore throat.

## 2022-12-14 NOTE — Discharge Instructions (Addendum)
You were seen in the ED today with sore throat. You have strep throat and I am taking you out of work until Monday. I have prescribed Toradol for your back pain. This is similar to Motrin so do not take additional Motrin at home. You may continue to take Tylenol. Please finish your antibiotics. Return with any new or suddenly worsening symptoms.

## 2022-12-14 NOTE — ED Provider Notes (Signed)
Emergency Department Provider Note   I have reviewed the triage vital signs and the nursing notes.   HISTORY  Chief Complaint Back Pain and Sore Throat   HPI Stephanie Clayton is a 37 y.o. female with history reviewed below presents to the emergency department for evaluation of sore throat for the past 2 days.  Symptoms been progressively worsening.  No fevers.  She has some fever fullness as well.  She also complains of some lower back discomfort which is more chronic.  No numbness or weakness.  No fevers.  No injury.  She follows with physical therapy and has been trying her exercises at home.   Past Medical History:  Diagnosis Date   Prediabetes    Sciatic leg pain     Review of Systems  Constitutional: No fever/chills ENT: Positive sore throat. Cardiovascular: Denies chest pain. Respiratory: Denies shortness of breath. Gastrointestinal: No abdominal pain.  No nausea, no vomiting.  No diarrhea.  No constipation. Genitourinary: Negative for dysuria. Musculoskeletal: Positive for back pain. Skin: Negative for rash. Neurological: Negative for headaches, focal weakness or numbness.   ____________________________________________   PHYSICAL EXAM:  VITAL SIGNS: ED Triage Vitals  Encounter Vitals Group     BP 12/14/22 2011 (!) 143/92     Pulse Rate 12/14/22 2011 84     Resp 12/14/22 2011 20     Temp 12/14/22 2011 98.1 F (36.7 C)     Temp Source 12/14/22 2011 Oral     SpO2 12/14/22 2011 99 %     Weight 12/14/22 2014 209 lb 4.8 oz (94.9 kg)     Height 12/14/22 2010 5\' 4"  (1.626 m)   Constitutional: Alert and oriented. Well appearing and in no acute distress. Eyes: Conjunctivae are normal. Head: Atraumatic. Nose: No congestion/rhinnorhea. Mouth/Throat: Mucous membranes are moist. Oropharynx erythematous w/ tonsillar hypertrophy bilaterally. No PTA.  Neck: No stridor.   Cardiovascular: Normal rate, regular rhythm. Good peripheral circulation. Grossly normal  heart sounds.   Respiratory: Normal respiratory effort.  No retractions. Lungs CTAB. Gastrointestinal: Soft and nontender. No distention.  Musculoskeletal: No lower extremity tenderness nor edema. No gross deformities of extremities. Neurologic:  Normal speech and language.  Skin:  Skin is warm, dry and intact. No rash noted.   ____________________________________________   LABS (all labs ordered are listed, but only abnormal results are displayed)  Labs Reviewed  GROUP A STREP BY PCR - Abnormal; Notable for the following components:      Result Value   Group A Strep by PCR DETECTED (*)    All other components within normal limits    ____________________________________________   PROCEDURES  Procedure(s) performed:   Procedures  None  ____________________________________________   INITIAL IMPRESSION / ASSESSMENT AND PLAN / ED COURSE  Pertinent labs & imaging results that were available during my care of the patient were reviewed by me and considered in my medical decision making (see chart for details).   This patient is Presenting for Evaluation of sore throat, which does require a range of treatment options, and is a complaint that involves a high risk of morbidity and mortality.  The Differential Diagnoses include strep pharyngitis, viral pharyngitis, PTA, RPA, Ludwig's angina, etc.  Critical Interventions-    Medications  ketorolac (TORADOL) 30 MG/ML injection 30 mg (30 mg Intramuscular Given 12/14/22 2221)  amoxicillin (AMOXIL) capsule 500 mg (500 mg Oral Given 12/14/22 2224)    Reassessment after intervention: pain improved.   Clinical Laboratory Tests Ordered, included Strep PCR positive.  Radiologic Tests: Considered back imaging but no red flag symptoms. Pain is chronic and unchanged today. Defer imaging for now.   Medical Decision Making: Summary:  Patient presents to the ED with sore throat. Strep positive from triage. No PTA. Plan for amoxicillin.  She has chronic back pain as well. No changes in symptoms or exam. Plan for Toradol and Robaxin for home along with abx and close PCP follow up.   Patient's presentation is most consistent with acute, uncomplicated illness.   Disposition: discharge  ____________________________________________  FINAL CLINICAL IMPRESSION(S) / ED DIAGNOSES  Final diagnoses:  Strep pharyngitis  Chronic midline low back pain without sciatica     NEW OUTPATIENT MEDICATIONS STARTED DURING THIS VISIT:  New Prescriptions   AMOXICILLIN (AMOXIL) 500 MG CAPSULE    Take 1 capsule (500 mg total) by mouth 2 (two) times daily for 10 days.   KETOROLAC (TORADOL) 10 MG TABLET    Take 1 tablet (10 mg total) by mouth every 6 (six) hours as needed for moderate pain (pain score 4-6).   METHOCARBAMOL (ROBAXIN) 500 MG TABLET    Take 1 tablet (500 mg total) by mouth every 8 (eight) hours as needed for muscle spasms.    Note:  This document was prepared using Dragon voice recognition software and may include unintentional dictation errors.  Alona Bene, MD, [facility] Emergency Medicine    Journi Moffa, Arlyss Repress, MD 12/14/22 2225
